# Patient Record
Sex: Male | Born: 1960 | Race: White | Hispanic: No | Marital: Married | Smoking: Never smoker
Health system: Southern US, Community
[De-identification: ages and names within clinical notes are randomized; demographics above are authoritative.]

## PROBLEM LIST (undated history)

## (undated) DIAGNOSIS — S59909A Unspecified injury of unspecified elbow, initial encounter: Secondary | ICD-10-CM

## (undated) HISTORY — PX: APPENDECTOMY: SHX54

## (undated) HISTORY — DX: Unspecified injury of unspecified elbow, initial encounter: S59.909A

---

## 2005-06-05 HISTORY — PX: CERVICAL DISC SURGERY: SHX588

## 2012-05-12 ENCOUNTER — Emergency Department: Payer: Self-pay | Admitting: Emergency Medicine

## 2012-05-12 LAB — CBC
HCT: 48.8 % (ref 40.0–52.0)
MCH: 28 pg (ref 26.0–34.0)
MCHC: 33.8 g/dL (ref 32.0–36.0)
MCV: 83 fL (ref 80–100)
Platelet: 202 10*3/uL (ref 150–440)
RBC: 5.89 10*6/uL (ref 4.40–5.90)
WBC: 8 10*3/uL (ref 3.8–10.6)

## 2012-05-12 LAB — COMPREHENSIVE METABOLIC PANEL
Albumin: 4.2 g/dL (ref 3.4–5.0)
Alkaline Phosphatase: 111 U/L (ref 50–136)
Anion Gap: 6 — ABNORMAL LOW (ref 7–16)
BUN: 16 mg/dL (ref 7–18)
Co2: 28 mmol/L (ref 21–32)
Creatinine: 1 mg/dL (ref 0.60–1.30)
EGFR (African American): >60
EGFR (Non-African Amer.): >60
Glucose: 120 mg/dL — ABNORMAL HIGH (ref 65–99)
Potassium: 3.6 mmol/L (ref 3.5–5.1)
SGOT(AST): 26 U/L (ref 15–37)
SGPT (ALT): 41 U/L (ref 12–78)
Total Protein: 7.8 g/dL (ref 6.4–8.2)

## 2012-05-12 LAB — URINALYSIS, COMPLETE
Bacteria: NONE SEEN
Bilirubin,UR: NEGATIVE
Blood: NEGATIVE
Glucose,UR: NEGATIVE mg/dL
Ketone: NEGATIVE
Leukocyte Esterase: NEGATIVE
Nitrite: NEGATIVE
Ph: 5
Protein: NEGATIVE
RBC,UR: 1 /HPF
Specific Gravity: 1.015
Squamous Epithelial: NONE SEEN
WBC UR: 3 /HPF

## 2012-05-12 LAB — LIPASE, BLOOD: Lipase: 115 U/L

## 2013-02-05 DIAGNOSIS — Z981 Arthrodesis status: Secondary | ICD-10-CM | POA: Insufficient documentation

## 2013-02-05 DIAGNOSIS — B078 Other viral warts: Secondary | ICD-10-CM | POA: Insufficient documentation

## 2013-02-06 DIAGNOSIS — I10 Essential (primary) hypertension: Secondary | ICD-10-CM | POA: Insufficient documentation

## 2013-02-11 LAB — HM HEPATITIS C SCREENING LAB: HM HEPATITIS C SCREENING: NEGATIVE

## 2013-04-05 LAB — HM COLONOSCOPY

## 2013-04-08 HISTORY — PX: COLONOSCOPY: SHX174

## 2014-11-04 ENCOUNTER — Telehealth: Payer: Self-pay

## 2014-11-04 MED ORDER — DOXYCYCLINE HYCLATE 100 MG PO TABS: 100.0000 mg | ORAL_TABLET | Freq: Two times a day (BID) | ORAL | Status: AC

## 2014-11-04 NOTE — Telephone Encounter (Signed)
Patient called said Levaquin made him sick, please send Doxycycline 100mg  to [facility]

## 2014-11-19 DIAGNOSIS — M5033 Other cervical disc degeneration, cervicothoracic region: Secondary | ICD-10-CM | POA: Insufficient documentation

## 2014-11-19 DIAGNOSIS — D1803 Hemangioma of intra-abdominal structures: Secondary | ICD-10-CM | POA: Insufficient documentation

## 2014-11-19 DIAGNOSIS — E785 Hyperlipidemia, unspecified: Secondary | ICD-10-CM | POA: Insufficient documentation

## 2014-11-19 DIAGNOSIS — J309 Allergic rhinitis, unspecified: Secondary | ICD-10-CM | POA: Insufficient documentation

## 2014-11-19 DIAGNOSIS — B009 Herpesviral infection, unspecified: Secondary | ICD-10-CM | POA: Insufficient documentation

## 2014-11-19 DIAGNOSIS — K219 Gastro-esophageal reflux disease without esophagitis: Secondary | ICD-10-CM | POA: Insufficient documentation

## 2014-11-19 DIAGNOSIS — G44209 Tension-type headache, unspecified, not intractable: Secondary | ICD-10-CM | POA: Insufficient documentation

## 2014-11-19 DIAGNOSIS — N281 Cyst of kidney, acquired: Secondary | ICD-10-CM | POA: Insufficient documentation

## 2014-11-19 DIAGNOSIS — K580 Irritable bowel syndrome with diarrhea: Secondary | ICD-10-CM | POA: Insufficient documentation

## 2014-11-19 DIAGNOSIS — Q984 Klinefelter syndrome, unspecified: Secondary | ICD-10-CM | POA: Insufficient documentation

## 2014-11-19 DIAGNOSIS — L209 Atopic dermatitis, unspecified: Secondary | ICD-10-CM | POA: Insufficient documentation

## 2014-11-19 DIAGNOSIS — J40 Bronchitis, not specified as acute or chronic: Secondary | ICD-10-CM | POA: Insufficient documentation

## 2014-11-20 ENCOUNTER — Encounter: Payer: Self-pay | Admitting: Internal Medicine

## 2014-11-20 ENCOUNTER — Ambulatory Visit (INDEPENDENT_AMBULATORY_CARE_PROVIDER_SITE_OTHER): Payer: Managed Care, Other (non HMO) | Admitting: Internal Medicine

## 2014-11-20 VITALS — BP 110/80 | HR 84 | Temp 97.6°F | Ht 69.0 in | Wt 180.6 lb

## 2014-11-20 DIAGNOSIS — J0100 Acute maxillary sinusitis, unspecified: Secondary | ICD-10-CM | POA: Diagnosis not present

## 2014-11-20 DIAGNOSIS — Z86010 Personal history of colonic polyps: Secondary | ICD-10-CM | POA: Diagnosis not present

## 2014-11-20 DIAGNOSIS — J40 Bronchitis, not specified as acute or chronic: Secondary | ICD-10-CM

## 2014-11-20 MED ORDER — HYDROCODONE-HOMATROPINE 5-1.5 MG/5ML PO SYRP: 5.0000 mL | ORAL_SOLUTION | Freq: Four times a day (QID) | ORAL | Status: DC | PRN

## 2014-11-20 MED ORDER — AMOXICILLIN-POT CLAVULANATE 875-125 MG PO TABS: 1.0000 | ORAL_TABLET | Freq: Two times a day (BID) | ORAL | Status: DC

## 2014-11-20 NOTE — Patient Instructions (Addendum)
Take antibiotics with food.   Supplement with a probiotic while taking antibiotics Use cough syrup as needed - mainly at night

## 2014-11-20 NOTE — Progress Notes (Signed)
Date:  11/20/2014   Name:  Joseph Chung   DOB:  [birth year]   MRN:  [id number]   Chief Complaint: Cough Cough This is a chronic problem. The current episode started 1 to 4 weeks ago (seen three weeks ago - treated with levaquin but could not tolerate so medication changed to doxycyline.). The problem has been gradually worsening. The problem occurs every few minutes. The cough is productive of sputum. Associated symptoms include postnasal drip, a sore throat and wheezing. Pertinent negatives include no chills, fever, headaches, hemoptysis, rhinorrhea or shortness of breath. The symptoms are aggravated by lying down and exercise. He has tried prescription cough suppressant (codeine cough syrup made him sick.) for the symptoms.     Review of Systems:  Review of Systems  Constitutional: Negative for fever and chills.  HENT: Positive for postnasal drip and sore throat. Negative for rhinorrhea, sinus pressure and trouble swallowing.   Respiratory: Positive for cough and wheezing. Negative for hemoptysis and shortness of breath.   Gastrointestinal: Negative for diarrhea and blood in stool.  Neurological: Negative for dizziness, light-headedness and headaches.  Hematological: Negative for adenopathy.    Patient Active Problem List   Diagnosis Date Noted  . Allergic rhinitis 11/19/2014  . AD (atopic dermatitis) 11/19/2014  . Bronchitis 11/19/2014  . 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 Dyslipidemia 11/19/2014  . Acid reflux 11/19/2014  . Hemangioma of liver 11/19/2014  . Herpes 11/19/2014  . Irritable bowel syndrome with diarrhea 11/19/2014  . Klinefelter syndrome 11/19/2014  . Acquired cyst of kidney 11/19/2014  . Headache, tension-type 11/19/2014  . BP (high blood pressure) 02/06/2013  . H/O arthrodesis 02/05/2013  . Common wart 02/05/2013    Prior to Admission medications   Medication Sig Start Date End Date Taking? Authorizing Provider  acyclovir cream  (ZOVIRAX) 5 % 1 application 2 (two) times daily. 02/06/13   Historical Provider, MD  aspirin EC 81 MG tablet Take 1 tablet by mouth daily.    Historical Provider, MD  Coconut Oil 1000 MG CAPS Take by mouth.    Historical Provider, MD  niacin (NIASPAN) 1000 MG CR tablet Take 2 tablets by mouth daily. 04/24/14   Historical Provider, MD  Omega-3 Fatty Acids (FISH OIL) 1000 MG CAPS Take 2 capsules by mouth 2 (two) times daily.    Historical Provider, MD  Omeprazole 20 MG TBEC Take 1 tablet by mouth 2 (two) times daily. 03/20/14   Historical Provider, MD  testosterone (ANDROGEL) 50 MG/5GM (1%) GEL Place 2 Units onto the skin daily. 08/14/14   Historical Provider, MD  vitamin E (VITAMIN E) 1000 UNIT capsule Take 2 capsules by mouth daily.    Historical Provider, MD    Allergies  Allergen Reactions  . Codeine Nausea And Vomiting and Nausea Only    Patient states he feels "near death" when taking    Past Surgical History  Procedure Laterality Date  . Cervical disc surgery  2007  . Appendectomy      History  Substance Use Topics  . Smoking status: Never Smoker   . Smokeless tobacco: Not on file  . Alcohol Use: No     Medication list has been reviewed and updated.  Physical Examination:  Physical Exam  Constitutional: He appears well-developed and well-nourished.  HENT:  Mouth/Throat: Oropharyngeal exudate (thick yellow phlegm) present.  Eyes: Conjunctivae are normal. Pupils are equal, round, and reactive to light.  Neck: Neck supple.  Cardiovascular: Normal rate, regular rhythm and normal heart sounds.  Pulmonary/Chest: Effort normal and breath sounds normal. No respiratory distress. He has no wheezes.  Lymphadenopathy:    He has no cervical adenopathy.  Psychiatric: He has a normal mood and affect.    BP 110/80 mmHg  Pulse 84  Temp(Src) 97.6 F (36.4 C)  Ht 5\' 9"  (1.753 m)  Wt 180 lb 9.6 oz (81.92 kg)  BMI 26.66 kg/m2  SpO2 98%  Assessment and Plan: 1.  Bronchitis Incompletely treated by Doxycycline - amoxicillin-clavulanate (AUGMENTIN) 875-125 MG per tablet; Take 1 tablet by mouth 2 (two) times daily.  Dispense: 20 tablet; Refill: 0 - HYDROcodone-homatropine (HYCODAN) 5-1.5 MG/5ML syrup; Take 5 mLs by mouth every 6 (six) hours as needed for cough.  Dispense: 120 mL; Refill: 0  2. Acute maxillary sinusitis, recurrence not specified - amoxicillin-clavulanate (AUGMENTIN) 875-125 MG per tablet; Take 1 tablet by mouth 2 (two) times daily.  Dispense: 20 tablet; Refill: 0   Halina Maidens, MD [facility]  11/20/2014

## 2015-03-24 ENCOUNTER — Other Ambulatory Visit: Payer: Self-pay | Admitting: Internal Medicine

## 2015-04-05 ENCOUNTER — Ambulatory Visit (INDEPENDENT_AMBULATORY_CARE_PROVIDER_SITE_OTHER): Payer: Managed Care, Other (non HMO) | Admitting: Internal Medicine

## 2015-04-05 ENCOUNTER — Encounter: Payer: Self-pay | Admitting: Internal Medicine

## 2015-04-05 VITALS — BP 110/78 | HR 96 | Temp 97.8°F | Ht 70.0 in | Wt 174.0 lb

## 2015-04-05 DIAGNOSIS — J019 Acute sinusitis, unspecified: Secondary | ICD-10-CM

## 2015-04-05 DIAGNOSIS — K649 Unspecified hemorrhoids: Secondary | ICD-10-CM

## 2015-04-05 MED ORDER — FLUTICASONE PROPIONATE 50 MCG/ACT NA SUSP: 2.0000 | Freq: Every day | NASAL | Status: DC

## 2015-04-05 MED ORDER — AMOXICILLIN-POT CLAVULANATE 875-125 MG PO TABS: 1.0000 | ORAL_TABLET | Freq: Two times a day (BID) | ORAL | Status: DC

## 2015-04-05 NOTE — Progress Notes (Signed)
Date:  04/05/2015   Name:  Joseph Chung   DOB:  [birth year]   MRN:  [id number]   Chief Complaint: Sinusitis  Patient complains of several days of sinus congestion along with sneezing, sore throat, cough, and watery eyes. He's been using Mucinex with no benefit. He has also been using Sudafed 30 mg once or twice per day. He is having significant nasal congestion so that is having to sleep sitting up. Been producing some thick yellow mucus. He denies any wheezing or fever. No nausea or vomiting. Rectal bleeding - Patient has a history of colon polyps, last colonoscopy 2014. He now complains of hemorrhoids with blood in his stool. This has been occurring off and on for several months. He's used over-the-counter hemorrhoid cream without benefit. He is requesting a referral for another colonoscopy. I suggest an exam today which he declines or a referral to Gen. Surgery.  Review of Systems  Constitutional: Positive for fatigue. Negative for fever and chills.  HENT: Positive for postnasal drip, rhinorrhea, sinus pressure, sneezing and sore throat. Negative for ear pain, nosebleeds, tinnitus, trouble swallowing and voice change.   Eyes: Negative for visual disturbance.  Respiratory: Positive for cough and shortness of breath. Negative for chest tightness and wheezing.   Cardiovascular: Negative for chest pain, palpitations and leg swelling.  Gastrointestinal: Positive for anal bleeding. Negative for diarrhea and constipation.  Neurological: Negative for dizziness and headaches.  Hematological: Negative for adenopathy.  Psychiatric/Behavioral: Positive for sleep disturbance.    Patient Active Problem List   Diagnosis Date Noted  . Hx of adenomatous colonic polyps 11/20/2014  . Allergic rhinitis 11/19/2014  . AD (atopic dermatitis) 11/19/2014  . Bronchitis 11/19/2014  . 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 Dyslipidemia 11/19/2014  . Acid reflux 11/19/2014  . Hemangioma of  liver 11/19/2014  . Herpes 11/19/2014  . Irritable bowel syndrome with diarrhea 11/19/2014  . Klinefelter syndrome 11/19/2014  . Acquired cyst of kidney 11/19/2014  . Headache, tension-type 11/19/2014  . BP (high blood pressure) 02/06/2013  . H/O arthrodesis 02/05/2013  . Common wart 02/05/2013    Prior to Admission medications   Medication Sig Start Date End Date Taking? Authorizing Provider  acyclovir cream (ZOVIRAX) 5 % 1 application 2 (two) times daily. 02/06/13  Yes Historical Provider, MD  ANDROGEL 50 MG/5GM (1%) GEL APPLY THE CONTENTS OF TWO PACKETS TOPICALLY DAILY 03/24/15  Yes Glean Hess, MD  aspirin EC 81 MG tablet Take 1 tablet by mouth daily.   Yes Historical Provider, MD  Coconut Oil 1000 MG CAPS Take by mouth.   Yes Historical Provider, MD  niacin (NIASPAN) 1000 MG CR tablet Take 2 tablets by mouth daily. 04/24/14  Yes Historical Provider, MD  Omega-3 Fatty Acids (FISH OIL) 1000 MG CAPS Take 2 capsules by mouth 2 (two) times daily.   Yes Historical Provider, MD  omeprazole (PRILOSEC) 20 MG capsule TAKE ONE CAPSULE BY MOUTH TWICE A DAY 03/24/15  Yes Glean Hess, MD  vitamin E (VITAMIN E) 1000 UNIT capsule Take 2 capsules by mouth daily.   Yes Historical Provider, MD    Allergies  Allergen Reactions  . Codeine Nausea And Vomiting and Nausea Only    Patient states he feels "near death" when taking  . Levaquin [Levofloxacin In D5w] Nausea And Vomiting    Past Surgical History  Procedure Laterality Date  . Cervical disc surgery  2007  . Appendectomy      Social History  Substance Use Topics  .  Smoking status: Never Smoker   . Smokeless tobacco: None  . Alcohol Use: No    Medication list has been reviewed and updated.   Physical Exam  Constitutional: He is oriented to person, place, and time. He appears well-developed. No distress.  HENT:  Head: Normocephalic and atraumatic.  Right Ear: Tympanic membrane and ear canal normal.  Left Ear: Tympanic  membrane and ear canal normal.  Nose: Right sinus exhibits maxillary sinus tenderness and frontal sinus tenderness. Left sinus exhibits maxillary sinus tenderness and frontal sinus tenderness.  Mouth/Throat: Uvula is midline. Posterior oropharyngeal erythema present.  Eyes: Conjunctivae are normal. Right eye exhibits no discharge. Left eye exhibits no discharge. No scleral icterus.  Neck: No thyromegaly present.  Cardiovascular: Normal rate, regular rhythm and normal heart sounds.   Pulmonary/Chest: Effort normal and breath sounds normal. No respiratory distress.  Abdominal: There is no tenderness.  Musculoskeletal: Normal range of motion.  Neurological: He is alert and oriented to person, place, and time.  Skin: Skin is warm and dry. No rash noted.  Psychiatric: He has a normal mood and affect. His speech is normal and behavior is normal. Thought content normal.  Nursing note and vitals reviewed.   BP 110/78 mmHg  Pulse 96  Temp(Src) 97.8 F (36.6 C)  Ht 5\' 10"  (1.778 m)  Wt 174 lb (78.926 kg)  BMI 24.97 kg/m2  SpO2 99%  Assessment and Plan: 1. Acute sinusitis, recurrence not specified, unspecified location Begin Allegra or Claritin daily Sudafed 30 mg every 4 hours - amoxicillin-clavulanate (AUGMENTIN) 875-125 MG tablet; Take 1 tablet by mouth 2 (two) times daily.  Dispense: 20 tablet; Refill: 0 - fluticasone (FLONASE) 50 MCG/ACT nasal spray; Place 2 sprays into both nostrils daily.  Dispense: 16 g; Refill: 6  2. Bleeding hemorrhoid Patient declines rectal exam today Continue over-the-counter hemorrhoid cream May need General Surgery referral if bleeding continues   Halina Maidens, MD [facility]  04/05/2015

## 2015-04-05 NOTE — Patient Instructions (Signed)
Get Allegra 180 mg or Claritin 10 mg - take once a day  Can take sudafed (or generic) every 4 hours for congestion  Use Flonase and Amox-clavulanate as instructed  If bleeding continues - call for a referral to General Surgery

## 2015-05-13 ENCOUNTER — Other Ambulatory Visit: Payer: Self-pay

## 2015-05-13 DIAGNOSIS — J019 Acute sinusitis, unspecified: Secondary | ICD-10-CM

## 2015-06-04 ENCOUNTER — Other Ambulatory Visit: Payer: Self-pay

## 2015-06-04 DIAGNOSIS — J019 Acute sinusitis, unspecified: Secondary | ICD-10-CM

## 2015-06-04 MED ORDER — ACYCLOVIR 5 % EX CREA: 1.0000 "application " | TOPICAL_CREAM | Freq: Two times a day (BID) | CUTANEOUS | Status: AC

## 2015-06-04 MED ORDER — FLUTICASONE PROPIONATE 50 MCG/ACT NA SUSP: 2.0000 | Freq: Every day | NASAL | Status: DC

## 2015-06-04 MED ORDER — OMEPRAZOLE 20 MG PO CPDR: 20.0000 mg | DELAYED_RELEASE_CAPSULE | Freq: Two times a day (BID) | ORAL | Status: DC

## 2015-06-04 MED ORDER — NIACIN ER (ANTIHYPERLIPIDEMIC) 1000 MG PO TBCR: 2000.0000 mg | EXTENDED_RELEASE_TABLET | Freq: Every day | ORAL | Status: AC

## 2015-06-04 MED ORDER — TESTOSTERONE 50 MG/5GM (1%) TD GEL: 10.0000 g | Freq: Every day | TRANSDERMAL | Status: DC

## 2015-06-04 MED ORDER — FISH OIL 1000 MG PO CAPS: 2.0000 | ORAL_CAPSULE | Freq: Two times a day (BID) | ORAL | Status: DC

## 2015-07-26 ENCOUNTER — Ambulatory Visit: Payer: Self-pay | Admitting: Internal Medicine

## 2015-07-26 ENCOUNTER — Other Ambulatory Visit: Payer: Self-pay

## 2015-07-26 MED ORDER — OSELTAMIVIR PHOSPHATE 75 MG PO CAPS: 75.0000 mg | ORAL_CAPSULE | Freq: Two times a day (BID) | ORAL | Status: DC

## 2015-07-26 NOTE — Telephone Encounter (Signed)
Patient called in this morning stating that his wife tested positive for Influenza A yesterday and states that his symptoms started suddently last night and would like Tamiflu sent in to pharmacy. Please Advise.

## 2015-09-03 ENCOUNTER — Other Ambulatory Visit: Payer: Self-pay | Admitting: Internal Medicine

## 2015-09-03 MED ORDER — OMEPRAZOLE 20 MG PO CPDR: 20.0000 mg | DELAYED_RELEASE_CAPSULE | Freq: Every day | ORAL | Status: DC

## 2015-09-10 ENCOUNTER — Other Ambulatory Visit: Payer: Self-pay | Admitting: Internal Medicine

## 2015-09-10 ENCOUNTER — Telehealth: Payer: Self-pay

## 2015-09-10 MED ORDER — TESTOSTERONE 50 MG/5GM (1%) TD GEL: 10.0000 g | Freq: Every day | TRANSDERMAL | Status: DC

## 2015-09-10 NOTE — Telephone Encounter (Signed)
Patient needs refill sent to pharmacy

## 2015-09-29 ENCOUNTER — Ambulatory Visit
Admission: RE | Admit: 2015-09-29 | Discharge: 2015-09-29 | Disposition: A | Discharge status: Home / Self Care | Payer: Managed Care, Other (non HMO) | Source: Ambulatory Visit | Attending: Internal Medicine | Admitting: Internal Medicine

## 2015-09-29 ENCOUNTER — Ambulatory Visit (INDEPENDENT_AMBULATORY_CARE_PROVIDER_SITE_OTHER): Payer: Managed Care, Other (non HMO) | Admitting: Internal Medicine

## 2015-09-29 ENCOUNTER — Encounter: Payer: Self-pay | Admitting: Internal Medicine

## 2015-09-29 VITALS — BP 114/78 | HR 84 | Ht 70.0 in | Wt 176.0 lb

## 2015-09-29 DIAGNOSIS — J0101 Acute recurrent maxillary sinusitis: Secondary | ICD-10-CM

## 2015-09-29 DIAGNOSIS — K219 Gastro-esophageal reflux disease without esophagitis: Secondary | ICD-10-CM

## 2015-09-29 DIAGNOSIS — S59901A Unspecified injury of right elbow, initial encounter: Secondary | ICD-10-CM

## 2015-09-29 DIAGNOSIS — S59909A Unspecified injury of unspecified elbow, initial encounter: Secondary | ICD-10-CM

## 2015-09-29 DIAGNOSIS — X58XXXA Exposure to other specified factors, initial encounter: Secondary | ICD-10-CM | POA: Diagnosis not present

## 2015-09-29 DIAGNOSIS — M7989 Other specified soft tissue disorders: Secondary | ICD-10-CM | POA: Insufficient documentation

## 2015-09-29 HISTORY — DX: Unspecified injury of unspecified elbow, initial encounter: S59.909A

## 2015-09-29 MED ORDER — FLUTICASONE PROPIONATE 50 MCG/ACT NA SUSP: 2.0000 | Freq: Every day | NASAL | Status: DC

## 2015-09-29 MED ORDER — CEFDINIR 300 MG PO CAPS: 300.0000 mg | ORAL_CAPSULE | Freq: Two times a day (BID) | ORAL | Status: DC

## 2015-09-29 NOTE — Progress Notes (Signed)
Date:  09/29/2015   Name:  Joseph Chung   DOB:  08-09-1960   MRN:  [id number]   Chief Complaint: Sore Throat and Elbow Pain Sore Throat  This is a new problem. The problem has been unchanged. Neither side of throat is experiencing more pain than the other. There has been no fever. The pain is mild. Associated symptoms include congestion, a plugged ear sensation, shortness of breath and swollen glands.  Arm Pain  The incident occurred 2 days ago. The incident occurred at home. The injury mechanism was a direct blow. The pain is present in the right elbow. The quality of the pain is described as aching. The pain does not radiate. The pain is moderate. Associated symptoms include muscle weakness. Pertinent negatives include no chest pain or numbness. The symptoms are aggravated by movement. He has tried rest for the symptoms.  Gastroesophageal Reflux He complains of belching, heartburn and a sore throat. He reports no chest pain or no wheezing. if he uses the over the counter omeprazole. This is a recurrent problem. The problem occurs frequently. Pertinent negatives include no fatigue. There are no known risk factors. He has tried a PPI for the symptoms.  His insurance is denying bid omeprazole.  I think they cover once daily but he is not sure.  I suggest he try otc Prevacid if omeprazole not covered.    Review of Systems  Constitutional: Negative for fever, chills and fatigue.  HENT: Positive for congestion, sinus pressure and sore throat.   Respiratory: Positive for shortness of breath. Negative for chest tightness and wheezing.   Cardiovascular: Negative for chest pain, palpitations and leg swelling.  Gastrointestinal: Positive for heartburn.  Musculoskeletal: Positive for joint swelling and arthralgias.  Neurological: Negative for numbness.    Patient Active Problem List   Diagnosis Date Noted  . Hx of adenomatous colonic polyps 11/20/2014  . Allergic rhinitis 11/19/2014  . AD  (atopic dermatitis) 11/19/2014  . 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 Dyslipidemia 11/19/2014  . Acid reflux 11/19/2014  . Hemangioma of liver 11/19/2014  . Herpes 11/19/2014  . Irritable bowel syndrome with diarrhea 11/19/2014  . Klinefelter syndrome 11/19/2014  . Acquired cyst of kidney 11/19/2014  . Headache, tension-type 11/19/2014  . BP (high blood pressure) 02/06/2013  . H/O arthrodesis 02/05/2013  . Common wart 02/05/2013    Prior to Admission medications   Medication Sig Start Date End Date Taking? Authorizing Provider  acyclovir cream (ZOVIRAX) 5 % Apply 1 application topically 2 (two) times daily. 06/04/15  Yes Glean Hess, MD  aspirin EC 81 MG tablet Take 1 tablet by mouth daily.   Yes Historical Provider, MD  Biotin 10 MG TABS Take by mouth.   Yes Historical Provider, MD  Coconut Oil 1000 MG CAPS Take by mouth.   Yes Historical Provider, MD  milk thistle 175 MG tablet Take 175 mg by mouth daily.   Yes Historical Provider, MD  niacin (NIASPAN) 1000 MG CR tablet Take 2 tablets (2,000 mg total) by mouth daily. 06/04/15  Yes Glean Hess, MD  Omega-3 Fatty Acids (FISH OIL) 1000 MG CAPS Take 2 capsules (2,000 mg total) by mouth 2 (two) times daily. 06/04/15  Yes Glean Hess, MD  omeprazole (PRILOSEC) 20 MG capsule Take 1 capsule (20 mg total) by mouth daily. 09/03/15  Yes Glean Hess, MD  testosterone (ANDROGEL) 50 MG/5GM (1%) GEL Place 10 g onto the skin daily. 09/10/15  Yes Mickel Baas  Ines Bloomer, MD  vitamin A 10000 UNIT capsule Take 2,000 Units by mouth daily.   Yes Historical Provider, MD  vitamin C (ASCORBIC ACID) 500 MG tablet Take 500 mg by mouth daily.   Yes Historical Provider, MD  vitamin E (VITAMIN E) 1000 UNIT capsule Take 2 capsules by mouth daily.   Yes Historical Provider, MD    Allergies  Allergen Reactions  . Codeine Nausea And Vomiting and Nausea Only    Patient states he feels "near death" when taking  . Levaquin  [Levofloxacin In D5w] Nausea And Vomiting    Past Surgical History  Procedure Laterality Date  . Cervical disc surgery  2007  . Appendectomy      Social History  Substance Use Topics  . Smoking status: Never Smoker   . Smokeless tobacco: None  . Alcohol Use: No     Medication list has been reviewed and updated.   Physical Exam  Constitutional: He is oriented to person, place, and time. He appears well-developed. No distress.  HENT:  Head: Normocephalic and atraumatic.  Neck: Normal range of motion. Neck supple.  Cardiovascular: Normal rate, regular rhythm and normal heart sounds.   Pulmonary/Chest: Effort normal and breath sounds normal. No respiratory distress. He has no wheezes.  Abdominal: Soft. There is no tenderness. There is no rebound.  Musculoskeletal: Normal range of motion.       Right elbow: He exhibits swelling and effusion. Tenderness found. Lateral epicondyle tenderness noted.  Decreased grip strength on right  Lymphadenopathy:    He has no cervical adenopathy.  Neurological: He is alert and oriented to person, place, and time.  Skin: Skin is warm and dry. No rash noted.  Psychiatric: He has a normal mood and affect. His behavior is normal. Thought content normal.  Nursing note and vitals reviewed.   BP 114/78 mmHg  Pulse 84  Ht 5\' 10"  (1.778 m)  Wt 176 lb (79.833 kg)  BMI 25.25 kg/m2  Assessment and Plan: 1. Acute recurrent maxillary sinusitis - cefdinir (OMNICEF) 300 MG capsule; Take 1 capsule (300 mg total) by mouth 2 (two) times daily.  Dispense: 20 capsule; Refill: 0 - fluticasone (FLONASE) 50 MCG/ACT nasal spray; Place 2 sprays into both nostrils daily.  Dispense: 16 g; Refill: 3  2. Elbow injury, right, initial encounter Concern for fracture Limit movement - take advil if needed If fracture - will refer to Orthopedics - DG Elbow Complete Right; Future  3. Gastroesophageal reflux disease without esophagitis Has not been controlled on OTC  omeprazole If he can not get the omeprazole RX once daily, then try OTC Prevacid 15 mg   Halina Maidens, MD [facility]  09/29/2015

## 2015-09-30 ENCOUNTER — Telehealth: Payer: Self-pay

## 2015-09-30 NOTE — Telephone Encounter (Signed)
-----   Message from Glean Hess, MD sent at 09/30/2015  1:51 PM EDT ----- No fracture.  Continue advil or aleve as needed; use arm cautiously until healed.

## 2015-09-30 NOTE — Telephone Encounter (Signed)
Spoke with patient. Patient advised of all results and verbalized understanding. Will call back with any future questions or concerns. MAH  

## 2015-10-05 ENCOUNTER — Other Ambulatory Visit: Payer: Self-pay | Admitting: Internal Medicine

## 2015-10-08 ENCOUNTER — Telehealth: Payer: Self-pay

## 2015-10-08 NOTE — Telephone Encounter (Signed)
° °

## 2015-10-08 NOTE — Telephone Encounter (Signed)
Right elbow has gotten worse. Waking up in pain all night. Rotating IBP and Tyl but not helping. He usually hates narcotics. He had some old ones but threw away.

## 2015-10-18 ENCOUNTER — Other Ambulatory Visit: Payer: Self-pay | Admitting: Internal Medicine

## 2015-10-18 MED ORDER — OMEPRAZOLE 20 MG PO CPDR: 20.0000 mg | DELAYED_RELEASE_CAPSULE | Freq: Every day | ORAL | Status: DC

## 2015-10-19 ENCOUNTER — Telehealth: Payer: Self-pay

## 2015-10-19 NOTE — Telephone Encounter (Signed)
Left VM that he went to [facility] ER for severe abdominal pains and NVD. He was given Zofran and waited for 3 hours then left. After fluids and rest he feels much better today just FYI.

## 2016-01-26 ENCOUNTER — Telehealth: Payer: Self-pay

## 2016-01-31 ENCOUNTER — Ambulatory Visit: Payer: Self-pay | Admitting: Internal Medicine

## 2016-02-16 NOTE — Telephone Encounter (Signed)
Completed.

## 2016-04-05 ENCOUNTER — Ambulatory Visit (INDEPENDENT_AMBULATORY_CARE_PROVIDER_SITE_OTHER): Payer: Managed Care, Other (non HMO) | Admitting: Internal Medicine

## 2016-04-05 ENCOUNTER — Encounter: Payer: Self-pay | Admitting: Internal Medicine

## 2016-04-05 VITALS — BP 112/78 | HR 77 | Temp 97.7°F | Resp 16 | Ht 70.0 in | Wt 165.0 lb

## 2016-04-05 DIAGNOSIS — Z23 Encounter for immunization: Secondary | ICD-10-CM | POA: Diagnosis not present

## 2016-04-05 DIAGNOSIS — D126 Benign neoplasm of colon, unspecified: Secondary | ICD-10-CM

## 2016-04-05 DIAGNOSIS — J0100 Acute maxillary sinusitis, unspecified: Secondary | ICD-10-CM | POA: Diagnosis not present

## 2016-04-05 MED ORDER — BENZONATATE 200 MG PO CAPS: 200.0000 mg | ORAL_CAPSULE | Freq: Three times a day (TID) | ORAL | 0 refills | Status: DC | PRN

## 2016-04-05 MED ORDER — ONDANSETRON HCL 8 MG PO TABS: 8.0000 mg | ORAL_TABLET | Freq: Three times a day (TID) | ORAL | 0 refills | Status: DC | PRN

## 2016-04-05 MED ORDER — AMOXICILLIN-POT CLAVULANATE 875-125 MG PO TABS: 1.0000 | ORAL_TABLET | Freq: Two times a day (BID) | ORAL | 0 refills | Status: DC

## 2016-04-05 NOTE — Progress Notes (Signed)
Date:  04/05/2016   Name:  Joseph Chung   DOB:  [birth year]   MRN:  [id number]   Chief Complaint: Sinus Problem (Found 4 pills from Abx 2 years ago and took it. Has had sinus pain, headache, bodyache and coughing and SOB for 4 days. ); GI Problem (wants colonoscopy ordered and zofran to have around. Changed diet now vegitarian and drinking healthy smoothy in m orning now and using many oils and other vitamins for major health changes. ); and Immunizations (Tdap today ) Sinus Problem  This is a new problem. The current episode started in the past 7 days. The problem has been gradually worsening since onset. There has been no fever. Associated symptoms include coughing, headaches, sinus pressure and a sore throat. Pertinent negatives include no chills, ear pain or shortness of breath.   Colonoscopy - is due for his three year repeat.  He has not yet gotten a letter from GI and his not sure who it was.  It was done at the endoscopy center in [place].  Review of records shows it was Dr. Comer Locket.   Review of Systems  Constitutional: Negative for chills, fatigue and fever.  HENT: Positive for sinus pressure and sore throat. Negative for ear pain.   Eyes: Negative for visual disturbance.  Respiratory: Positive for cough. Negative for shortness of breath.   Gastrointestinal: Negative for abdominal distention and blood in stool.  Endocrine: Negative for polydipsia and polyuria.  Neurological: Positive for headaches. Negative for dizziness and syncope.    Patient Active Problem List   Diagnosis Date Noted  . Elbow injury 09/29/2015  . Hx of adenomatous colonic polyps 11/20/2014  . Allergic rhinitis 11/19/2014  . AD (atopic dermatitis) 11/19/2014  . 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 Dyslipidemia 11/19/2014  . Acid reflux 11/19/2014  . Hemangioma of liver 11/19/2014  . Herpes 11/19/2014  . Irritable bowel syndrome with diarrhea 11/19/2014  . Klinefelter syndrome  11/19/2014  . Acquired cyst of kidney 11/19/2014  . Headache, tension-type 11/19/2014  . BP (high blood pressure) 02/06/2013  . H/O arthrodesis 02/05/2013  . Common wart 02/05/2013    Prior to Admission medications   Medication Sig Start Date End Date Taking? Authorizing Provider  acyclovir cream (ZOVIRAX) 5 % Apply 1 application topically 2 (two) times daily. 06/04/15  Yes Glean Hess, MD  aspirin EC 81 MG tablet Take 1 tablet by mouth every other day.    Yes Historical Provider, MD  Biotin 10 MG TABS Take by mouth.   Yes Historical Provider, MD  CHARCOAL ACTIVATED PO Take by mouth.   Yes Historical Provider, MD  Coconut Oil 1000 MG CAPS Take by mouth.   Yes Historical Provider, MD  milk thistle 175 MG tablet Take 175 mg by mouth daily.   Yes Historical Provider, MD  niacin (NIASPAN) 1000 MG CR tablet Take 2 tablets (2,000 mg total) by mouth daily. 06/04/15  Yes Glean Hess, MD  olive oil external oil Apply topically as needed.   Yes Historical Provider, MD  Omega-3 Fatty Acids (FISH OIL) 1000 MG CAPS Take 2 capsules (2,000 mg total) by mouth 2 (two) times daily. 06/04/15  Yes Glean Hess, MD  testosterone (ANDROGEL) 50 MG/5GM (1%) GEL Place 10 g onto the skin daily. 09/10/15  Yes Glean Hess, MD    Allergies  Allergen Reactions  . Codeine Nausea And Vomiting and Nausea Only    Patient states he feels "near death" when  taking  . Levaquin [Levofloxacin In D5w] Nausea And Vomiting    Past Surgical History:  Procedure Laterality Date  . APPENDECTOMY    . [place] SURGERY  2007    Social History  Substance Use Topics  . Smoking status: Never Smoker  . Smokeless tobacco: Never Used  . Alcohol use No     Medication list has been reviewed and updated.   Physical Exam  Constitutional: He is oriented to person, place, and time. He appears well-developed and well-nourished.  HENT:  Right Ear: External ear and ear canal normal. Tympanic membrane is not  erythematous and not retracted.  Left Ear: External ear and ear canal normal. Tympanic membrane is not erythematous and not retracted.  Nose: Right sinus exhibits maxillary sinus tenderness and frontal sinus tenderness. Left sinus exhibits maxillary sinus tenderness and frontal sinus tenderness.  Mouth/Throat: Uvula is midline and mucous membranes are normal. No oral lesions. No oropharyngeal exudate or posterior oropharyngeal erythema.  Cardiovascular: Normal rate, regular rhythm and normal heart sounds.   Pulmonary/Chest: Breath sounds normal. He has no wheezes. He has no rales.  Lymphadenopathy:    He has no cervical adenopathy.  Neurological: He is alert and oriented to person, place, and time.    BP 112/78   Pulse 77   Temp 97.7 F (36.5 C) (Oral)   Resp 16   Ht 5\' 10"  (1.778 m)   Wt 165 lb (74.8 kg)   SpO2 97%   BMI 23.68 kg/m   Assessment and Plan 1. Acute maxillary sinusitis, recurrence not specified - amoxicillin-clavulanate (AUGMENTIN) 875-125 MG tablet; Take 1 tablet by mouth 2 (two) times daily.  Dispense: 20 tablet; Refill: 0 - benzonatate (TESSALON) 200 MG capsule; Take 1 capsule (200 mg total) by mouth 3 (three) times daily as needed for cough.  Dispense: 30 capsule; Refill: 0  2. Need for diphtheria-tetanus-pertussis (Tdap) vaccine - Tdap vaccine greater than or equal to 7yo IM  3. Serrated adenoma of colon Due for follow up with Dr. Comer Locket   Halina Maidens, MD [facility]  04/05/2016

## 2016-04-05 NOTE — Patient Instructions (Signed)
Tdap Vaccine (Tetanus, Diphtheria and Pertussis): What You Need to Know 1. Why get vaccinated? Tetanus, diphtheria and pertussis are very serious diseases. Tdap vaccine can protect us from these diseases. And, Tdap vaccine given to pregnant women can protect newborn babies against pertussis. TETANUS (Lockjaw) is rare in the [place] today. It causes painful muscle tightening and stiffness, usually all over the body.  It can lead to tightening of muscles in the head and neck so you can't open your mouth, swallow, or sometimes even breathe. Tetanus kills about 1 out of 10 people who are infected even after receiving the best medical care. DIPHTHERIA is also rare in the [place] today. It can cause a thick coating to form in the back of the throat.  It can lead to breathing problems, heart failure, paralysis, and death. PERTUSSIS (Whooping Cough) causes severe coughing spells, which can cause difficulty breathing, vomiting and disturbed sleep.  It can also lead to weight loss, incontinence, and rib fractures. Up to 2 in 100 adolescents and 5 in 100 adults with pertussis are hospitalized or have complications, which could include pneumonia or death. These diseases are caused by bacteria. Diphtheria and pertussis are spread from person to person through secretions from coughing or sneezing. Tetanus enters the body through cuts, scratches, or wounds. Before vaccines, as many as 200,000 cases of diphtheria, 200,000 cases of pertussis, and hundreds of cases of tetanus, were reported in the [place] each year. Since vaccination began, reports of cases for tetanus and diphtheria have dropped by about 99% and for pertussis by about 80%. 2. Tdap vaccine Tdap vaccine can protect adolescents and adults from tetanus, diphtheria, and pertussis. One dose of Tdap is routinely given at age 11 or 12. People who did not get Tdap at that age should get it as soon as possible. Tdap is especially important  for healthcare professionals and anyone having close contact with a baby younger than 12 months. Pregnant women should get a dose of Tdap during every pregnancy, to protect the newborn from pertussis. Infants are most at risk for severe, life-threatening complications from pertussis. Another vaccine, called Td, protects against tetanus and diphtheria, but not pertussis. A Td booster should be given every 10 years. Tdap may be given as one of these boosters if you have never gotten Tdap before. Tdap may also be given after a severe cut or burn to prevent tetanus infection. Your doctor or the person giving you the vaccine can give you more information. Tdap may safely be given at the same time as other vaccines. 3. Some people should not get this vaccine  A person who has ever had a life-threatening allergic reaction after a previous dose of any diphtheria, tetanus or pertussis containing vaccine, OR has a severe allergy to any part of this vaccine, should not get Tdap vaccine. Tell the person giving the vaccine about any severe allergies.  Anyone who had coma or long repeated seizures within 7 days after a childhood dose of DTP or DTaP, or a previous dose of Tdap, should not get Tdap, unless a cause other than the vaccine was found. They can still get Td.  Talk to your doctor if you:  have seizures or another nervous system problem,  had severe pain or swelling after any vaccine containing diphtheria, tetanus or pertussis,  ever had a condition called Guillain-Barr Syndrome (GBS),  aren't feeling well on the day the shot is scheduled. 4. Risks With any medicine, including vaccines, there is   a chance of side effects. These are usually mild and go away on their own. Serious reactions are also possible but are rare. Most people who get Tdap vaccine do not have any problems with it. Mild problems following Tdap (Did not interfere with activities)  Pain where the shot was given (about 3 in 4  adolescents or 2 in 3 adults)  Redness or swelling where the shot was given (about 1 person in 5)  Mild fever of at least 100.4F (up to about 1 in 25 adolescents or 1 in 100 adults)  Headache (about 3 or 4 people in 10)  Tiredness (about 1 person in 3 or 4)  Nausea, vomiting, diarrhea, stomach ache (up to 1 in 4 adolescents or 1 in 10 adults)  Chills, sore joints (about 1 person in 10)  Body aches (about 1 person in 3 or 4)  Rash, swollen glands (uncommon) Moderate problems following Tdap (Interfered with activities, but did not require medical attention)  Pain where the shot was given (up to 1 in 5 or 6)  Redness or swelling where the shot was given (up to about 1 in 16 adolescents or 1 in 12 adults)  Fever over 102F (about 1 in 100 adolescents or 1 in 250 adults)  Headache (about 1 in 7 adolescents or 1 in 10 adults)  Nausea, vomiting, diarrhea, stomach ache (up to 1 or 3 people in 100)  Swelling of the entire arm where the shot was given (up to about 1 in 500). Severe problems following Tdap (Unable to perform usual activities; required medical attention)  Swelling, severe pain, bleeding and redness in the arm where the shot was given (rare). Problems that could happen after any vaccine:  People sometimes faint after a medical procedure, including vaccination. Sitting or lying down for about 15 minutes can help prevent fainting, and injuries caused by a fall. Tell your doctor if you feel dizzy, or have vision changes or ringing in the ears.  Some people get severe pain in the shoulder and have difficulty moving the arm where a shot was given. This happens very rarely.  Any medication can cause a severe allergic reaction. Such reactions from a vaccine are very rare, estimated at fewer than 1 in a million doses, and would happen within a few minutes to a few hours after the vaccination. As with any medicine, there is a very remote chance of a vaccine causing a serious  injury or death. The safety of vaccines is always being monitored. For more information, visit: www.cdc.gov/vaccinesafety/ 5. What if there is a serious problem? What should I look for?  Look for anything that concerns you, such as signs of a severe allergic reaction, very high fever, or unusual behavior.  Signs of a severe allergic reaction can include hives, swelling of the face and throat, difficulty breathing, a fast heartbeat, dizziness, and weakness. These would usually start a few minutes to a few hours after the vaccination. What should I do?  If you think it is a severe allergic reaction or other emergency that can't wait, call 9-1-1 or get the person to the nearest hospital. Otherwise, call your doctor.  Afterward, the reaction should be reported to the Vaccine Adverse Event Reporting System (VAERS). Your doctor might file this report, or you can do it yourself through the VAERS web site at www.vaers.hhs.gov, or by calling 1-800-822-7967. VAERS does not give medical advice.  6. The National Vaccine Injury Compensation Program The National Vaccine Injury Compensation Program (  VICP) is a federal program that was created to compensate people who may have been injured by certain vaccines. Persons who believe they may have been injured by a vaccine can learn about the program and about filing a claim by calling 1-800-338-2382 or visiting the VICP website at www.hrsa.gov/vaccinecompensation. There is a time limit to file a claim for compensation. 7. How can I learn more?  Ask your doctor. He or she can give you the vaccine package insert or suggest other sources of information.  Call your local or state health department.  Contact the Centers for Disease Control and Prevention (CDC):  Call 1-800-232-4636 (1-800-CDC-INFO) or  Visit CDC's website at www.cdc.gov/vaccines CDC Tdap Vaccine VIS (07/29/13)   This information is not intended to replace advice given to you by your health care  provider. Make sure you discuss any questions you have with your health care provider.   Document Released: 11/21/2011 Document Revised: 06/12/2014 Document Reviewed: 09/03/2013 Elsevier Interactive Patient Education 2016 Elsevier Inc.  

## 2016-04-06 ENCOUNTER — Other Ambulatory Visit: Payer: Self-pay

## 2016-04-21 ENCOUNTER — Telehealth: Payer: Self-pay

## 2016-04-21 ENCOUNTER — Other Ambulatory Visit: Payer: Self-pay | Admitting: Internal Medicine

## 2016-04-21 MED ORDER — CEFUROXIME AXETIL 250 MG/5ML PO SUSR: 500.0000 mg | Freq: Two times a day (BID) | ORAL | 0 refills | Status: DC

## 2016-04-21 NOTE — Telephone Encounter (Signed)
Ceftin suspension sent to pharmacy.

## 2016-04-21 NOTE — Telephone Encounter (Signed)
Does not feel Abx did not work. Will call back to see why not.

## 2016-04-21 NOTE — Telephone Encounter (Signed)
Cough is horrible at night and never has let up at all. The lidocaine made him feel like lungs were not working well. No fever. Some Sore throat and Chest congestion is worst. Patient feels worse. Please send new Rx.  Codeine Allergy. Would like to do dissolvable or liquid Abx that he can mix with yogurt.

## 2016-05-08 ENCOUNTER — Telehealth: Payer: Self-pay

## 2016-05-08 NOTE — Telephone Encounter (Signed)
Liquid Abx causing vomiting. Can not get any additional out of stock so he will no longer take any Abx at this time per VM.

## 2016-06-06 ENCOUNTER — Encounter: Payer: Self-pay | Admitting: Internal Medicine

## 2016-06-06 ENCOUNTER — Ambulatory Visit (INDEPENDENT_AMBULATORY_CARE_PROVIDER_SITE_OTHER): Payer: Managed Care, Other (non HMO) | Admitting: Internal Medicine

## 2016-06-06 VITALS — BP 148/96 | HR 88 | Temp 97.6°F | Ht 71.0 in | Wt 172.0 lb

## 2016-06-06 DIAGNOSIS — J40 Bronchitis, not specified as acute or chronic: Secondary | ICD-10-CM | POA: Diagnosis not present

## 2016-06-06 MED ORDER — CEFDINIR 300 MG PO CAPS: 300.0000 mg | ORAL_CAPSULE | Freq: Two times a day (BID) | ORAL | 0 refills | Status: DC

## 2016-06-06 MED ORDER — HYDROCODONE-HOMATROPINE 5-1.5 MG/5ML PO SYRP: 5.0000 mL | ORAL_SOLUTION | Freq: Four times a day (QID) | ORAL | 0 refills | Status: DC | PRN

## 2016-06-06 NOTE — Progress Notes (Signed)
Date:  06/06/2016   Name:  Caspar Tokarczyk   DOB:  [birth year]   MRN:  [id number]   Chief Complaint: Sinus Problem Sinus Problem  This is a new problem. The current episode started in the past 7 days. The problem has been gradually worsening since onset. There has been no fever. Associated symptoms include congestion, coughing and a sore throat. Pertinent negatives include no chills, diaphoresis, headaches, shortness of breath or sinus pressure. Past treatments include nothing.  His sx are also in his chest with cough and thick sputum, some sinus sx - mainly nasal congestion and difficulty breathing at night.  He has never had sinus surgery or ENT evaluation.  Review of Systems  Constitutional: Negative for chills, diaphoresis and fever.  HENT: Positive for congestion, rhinorrhea and sore throat. Negative for sinus pain and sinus pressure.   Respiratory: Positive for cough and chest tightness. Negative for shortness of breath and wheezing.   Cardiovascular: Negative for chest pain and palpitations.  Gastrointestinal: Negative for abdominal pain, nausea and vomiting.  Musculoskeletal: Negative for arthralgias.  Allergic/Immunologic: Negative for environmental allergies.  Neurological: Negative for dizziness and headaches.    Patient Active Problem List   Diagnosis Date Noted  . Serrated adenoma of colon 04/05/2016  . Elbow injury 09/29/2015  . Hx of adenomatous colonic polyps 11/20/2014  . Allergic rhinitis 11/19/2014  . AD (atopic dermatitis) 11/19/2014  . 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 Dyslipidemia 11/19/2014  . Acid reflux 11/19/2014  . Hemangioma of liver 11/19/2014  . Herpes 11/19/2014  . Irritable bowel syndrome with diarrhea 11/19/2014  . Klinefelter syndrome 11/19/2014  . Acquired cyst of kidney 11/19/2014  . Headache, tension-type 11/19/2014  . H/O arthrodesis 02/05/2013  . Common wart 02/05/2013    Prior to Admission medications     Medication Sig Start Date End Date Taking? Authorizing Provider  aspirin EC 81 MG tablet Take 1 tablet by mouth every other day.    Yes Historical Provider, MD  Biotin 10 MG TABS Take by mouth.   Yes Historical Provider, MD  CHARCOAL ACTIVATED PO Take by mouth.   Yes Historical Provider, MD  Coconut Oil 1000 MG CAPS Take by mouth.   Yes Historical Provider, MD  milk thistle 175 MG tablet Take 175 mg by mouth daily.   Yes Historical Provider, MD  niacin (NIASPAN) 1000 MG CR tablet Take 2 tablets (2,000 mg total) by mouth daily. 06/04/15  Yes Glean Hess, MD  olive oil external oil Apply topically as needed.   Yes Historical Provider, MD  Omega-3 Fatty Acids (FISH OIL) 1000 MG CAPS Take 2 capsules (2,000 mg total) by mouth 2 (two) times daily. 06/04/15  Yes Glean Hess, MD  ondansetron (ZOFRAN) 8 MG tablet Take 1 tablet (8 mg total) by mouth every 8 (eight) hours as needed for nausea or vomiting. 04/05/16  Yes Glean Hess, MD  testosterone (ANDROGEL) 50 MG/5GM (1%) GEL Place 10 g onto the skin daily. 09/10/15  Yes Glean Hess, MD  acyclovir cream (ZOVIRAX) 5 % Apply 1 application topically 2 (two) times daily. Patient not taking: Reported on 06/06/2016 06/04/15   Glean Hess, MD    Allergies  Allergen Reactions  . Codeine Nausea And Vomiting and Nausea Only    Patient states he feels "near death" when taking  . Levofloxacin Nausea And Vomiting    Past Surgical History:  Procedure Laterality Date  . APPENDECTOMY    . CERVICAL  [place] SURGERY  2007    Social History  Substance Use Topics  . Smoking status: Never Smoker  . Smokeless tobacco: Never Used  . Alcohol use No     Medication list has been reviewed and updated.   Physical Exam  Constitutional: He is oriented to person, place, and time. He appears well-developed and well-nourished.  HENT:  Right Ear: External ear and ear canal normal. Tympanic membrane is not erythematous and not retracted.  Left Ear:  External ear and ear canal normal. Tympanic membrane is not erythematous and not retracted.  Nose: Right sinus exhibits no maxillary sinus tenderness and no frontal sinus tenderness. Left sinus exhibits no maxillary sinus tenderness and no frontal sinus tenderness.  Mouth/Throat: Uvula is midline and mucous membranes are normal. No oral lesions. Posterior oropharyngeal erythema present. No oropharyngeal exudate.  Cardiovascular: Normal rate, regular rhythm and normal heart sounds.   Pulmonary/Chest: He has decreased breath sounds. He has no wheezes. He has no rhonchi. He has no rales.  Lymphadenopathy:    He has no cervical adenopathy.  Neurological: He is alert and oriented to person, place, and time.    BP (!) 148/96   Pulse 88   Temp 97.6 F (36.4 C)   Ht 5\' 11"  (1.803 m)   Wt 172 lb (78 kg)   SpO2 98%   BMI 23.99 kg/m   Assessment and Plan: 1. Bronchitis And possibly chronic sinusitis Consider ENT evaluation if no improvement - cefdinir (OMNICEF) 300 MG capsule; Take 1 capsule (300 mg total) by mouth 2 (two) times daily.  Dispense: 20 capsule; Refill: 0 - HYDROcodone-homatropine (HYCODAN) 5-1.5 MG/5ML syrup; Take 5 mLs by mouth every 6 (six) hours as needed for cough.  Dispense: 120 mL; Refill: 0   Halina Maidens, MD [facility]  06/06/2016

## 2016-06-09 ENCOUNTER — Telehealth: Payer: Self-pay | Admitting: Internal Medicine

## 2016-06-09 NOTE — Telephone Encounter (Signed)
Pt called need more few days antibiotic.Marland KitchenMarland Kitchen

## 2016-06-09 NOTE — Telephone Encounter (Signed)
I'm not giving him more antibiotics until he finishes what he has.

## 2016-06-12 NOTE — Telephone Encounter (Signed)
Called pt to finishes all antibiotic before getting more refill by Dr. Army Melia.

## 2016-06-30 ENCOUNTER — Telehealth: Payer: Self-pay | Admitting: Internal Medicine

## 2016-07-13 NOTE — Telephone Encounter (Signed)
error 

## 2016-09-20 ENCOUNTER — Telehealth: Payer: Self-pay

## 2016-09-20 NOTE — Telephone Encounter (Signed)
Pt called stating his L) foot at the bottom of his toes are swollen. They are red and inflamed and he states he cannot get his shoes on. He said this is been going on for 3 days now and he is concerned he may have stepped on spider out in his yard. After consulting with Dr. Army Melia, I told pt to go be seen at [facility] today to get foot checked out.

## 2016-09-29 ENCOUNTER — Telehealth: Payer: Self-pay

## 2016-09-29 NOTE — Telephone Encounter (Signed)
ERROR

## 2016-10-05 ENCOUNTER — Telehealth: Payer: Self-pay

## 2016-10-05 NOTE — Telephone Encounter (Signed)
Called pt, pt is scheduled for appt on 10/06/16.

## 2016-10-06 ENCOUNTER — Encounter: Payer: Self-pay | Admitting: Internal Medicine

## 2016-10-06 ENCOUNTER — Ambulatory Visit (INDEPENDENT_AMBULATORY_CARE_PROVIDER_SITE_OTHER): Payer: 59 | Admitting: Internal Medicine

## 2016-10-06 VITALS — BP 122/86 | HR 78 | Temp 98.0°F | Ht 71.0 in | Wt 175.0 lb

## 2016-10-06 DIAGNOSIS — Q984 Klinefelter syndrome, unspecified: Secondary | ICD-10-CM | POA: Diagnosis not present

## 2016-10-06 DIAGNOSIS — J029 Acute pharyngitis, unspecified: Secondary | ICD-10-CM | POA: Diagnosis not present

## 2016-10-06 MED ORDER — AMOXICILLIN 875 MG PO TABS: 875.0000 mg | ORAL_TABLET | Freq: Two times a day (BID) | ORAL | 0 refills | Status: DC

## 2016-10-06 MED ORDER — TESTOSTERONE 50 MG/5GM (1%) TD GEL: 10.0000 g | Freq: Every day | TRANSDERMAL | 1 refills | Status: DC

## 2016-10-06 NOTE — Progress Notes (Signed)
Date:  10/06/2016   Name:  Joseph Chung   DOB:  02/02/1961   MRN:  [id number]   Chief Complaint: Sore Throat (hurts in front of throat. When sneezing or blow nose its painful. When coughing "I drop to my knees it hurts so bad." LAst time ended up at hospital for this pain) Sore Throat   This is a chronic problem. The current episode started more than 1 month ago. The problem has been unchanged. Neither side of throat is experiencing more pain than the other. There has been no fever. The pain is moderate. Pertinent negatives include no coughing or diarrhea.  It hurts to sneeze and cough.  No pain with chewing, talking or swallowing.  He denies any injury.  He does have post nasal drip.  He has been having dental implants placed - a gradual process.    Review of Systems  Constitutional: Negative for chills and fever.  HENT: Positive for sore throat.   Respiratory: Negative for cough, chest tightness and wheezing.   Cardiovascular: Negative for chest pain and palpitations.  Gastrointestinal: Negative for diarrhea.  Skin: Positive for wound (large painful warts on multiple fingers).    Patient Active Problem List   Diagnosis Date Noted  . Serrated adenoma of colon 04/05/2016  . Elbow injury 09/29/2015  . Hx of adenomatous colonic polyps 11/20/2014  . Allergic rhinitis 11/19/2014  . AD (atopic dermatitis) 11/19/2014  . 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 Dyslipidemia 11/19/2014  . Acid reflux 11/19/2014  . Hemangioma of liver 11/19/2014  . Herpes 11/19/2014  . Irritable bowel syndrome with diarrhea 11/19/2014  . Klinefelter syndrome 11/19/2014  . Acquired cyst of kidney 11/19/2014  . Headache, tension-type 11/19/2014  . H/O arthrodesis 02/05/2013  . Common wart 02/05/2013    Prior to Admission medications   Medication Sig Start Date End Date Taking? Authorizing Provider  acyclovir cream (ZOVIRAX) 5 % Apply 1 application topically 2 (two) times  daily. 06/04/15  Yes Glean Hess, MD  aspirin EC 81 MG tablet Take 1 tablet by mouth every other day.    Yes Historical Provider, MD  Biotin 10 MG TABS Take by mouth.   Yes Historical Provider, MD  CHARCOAL ACTIVATED PO Take by mouth.   Yes Historical Provider, MD  Coconut Oil 1000 MG CAPS Take by mouth.   Yes Historical Provider, MD  milk thistle 175 MG tablet Take 175 mg by mouth daily.   Yes Historical Provider, MD  niacin (NIASPAN) 1000 MG CR tablet Take 2 tablets (2,000 mg total) by mouth daily. 06/04/15  Yes Glean Hess, MD  olive oil external oil Apply topically as needed.   Yes Historical Provider, MD  Omega-3 Fatty Acids (FISH OIL) 1000 MG CAPS Take 2 capsules (2,000 mg total) by mouth 2 (two) times daily. 06/04/15  Yes Glean Hess, MD  ondansetron (ZOFRAN) 8 MG tablet Take 1 tablet (8 mg total) by mouth every 8 (eight) hours as needed for nausea or vomiting. 04/05/16  Yes Glean Hess, MD  testosterone (ANDROGEL) 50 MG/5GM (1%) GEL Place 10 g onto the skin daily. 09/10/15  Yes Glean Hess, MD    Allergies  Allergen Reactions  . Codeine Nausea And Vomiting and Nausea Only    Patient states he feels "near death" when taking  . Levofloxacin Nausea And Vomiting    Past Surgical History:  Procedure Laterality Date  . APPENDECTOMY    . CERVICAL [place] SURGERY  2007    Social History  Substance Use Topics  . Smoking status: Never Smoker  . Smokeless tobacco: Never Used  . Alcohol use No     Medication list has been reviewed and updated.   Physical Exam  Constitutional: He is oriented to person, place, and time. He appears well-developed. No distress.  HENT:  Head: Normocephalic and atraumatic.  Right Ear: Tympanic membrane and ear canal normal.  Left Ear: Tympanic membrane and ear canal normal.  Nose: Right sinus exhibits no maxillary sinus tenderness. Left sinus exhibits no maxillary sinus tenderness.  Mouth/Throat: No posterior oropharyngeal edema  or posterior oropharyngeal erythema.  Cardiovascular: Normal rate, regular rhythm and normal heart sounds.   Pulmonary/Chest: Effort normal and breath sounds normal. No respiratory distress.  Musculoskeletal: He exhibits no edema.  Left TMJ click  Neurological: He is alert and oriented to person, place, and time.  Skin: Skin is warm and dry. Rash (warts in fingers) noted.  Psychiatric: He has a normal mood and affect. His behavior is normal. Thought content normal.  Nursing note and vitals reviewed.   BP 122/86 (BP Location: Right Arm, Patient Position: Sitting, Cuff Size: Normal)   Pulse 78   Temp 98 F (36.7 C)   Ht 5\' 11"  (1.803 m)   Wt 175 lb (79.4 kg)   SpO2 99%   BMI 24.41 kg/m   Assessment and Plan: 1. Pharyngitis, unspecified etiology Will treat for bacterial infection If no resolution, refer to ENT  2. Klinefelter syndrome  Meds ordered this encounter  Medications  . amoxicillin (AMOXIL) 875 MG tablet    Sig: Take 1 tablet (875 mg total) by mouth 2 (two) times daily.    Dispense:  20 tablet    Refill:  0  . testosterone (ANDROGEL) 50 MG/5GM (1%) GEL    Sig: Place 10 g onto the skin daily.    Dispense:  180 Package    Refill:  [place], MD [facility]  10/06/2016

## 2016-12-15 ENCOUNTER — Ambulatory Visit (INDEPENDENT_AMBULATORY_CARE_PROVIDER_SITE_OTHER): Payer: 59 | Admitting: Internal Medicine

## 2016-12-15 ENCOUNTER — Ambulatory Visit
Admission: RE | Admit: 2016-12-15 | Discharge: 2016-12-15 | Disposition: A | Discharge status: Home / Self Care | Payer: 59 | Source: Ambulatory Visit | Attending: Internal Medicine | Admitting: Internal Medicine

## 2016-12-15 ENCOUNTER — Encounter: Payer: Self-pay | Admitting: Internal Medicine

## 2016-12-15 ENCOUNTER — Telehealth: Payer: Self-pay

## 2016-12-15 VITALS — BP 136/84 | HR 88 | Temp 97.6°F | Ht 71.0 in | Wt 173.0 lb

## 2016-12-15 DIAGNOSIS — R0602 Shortness of breath: Secondary | ICD-10-CM

## 2016-12-15 DIAGNOSIS — J3089 Other allergic rhinitis: Secondary | ICD-10-CM

## 2016-12-15 DIAGNOSIS — K219 Gastro-esophageal reflux disease without esophagitis: Secondary | ICD-10-CM

## 2016-12-15 MED ORDER — MONTELUKAST SODIUM 10 MG PO TABS: 10.0000 mg | ORAL_TABLET | Freq: Every day | ORAL | 0 refills | Status: DC

## 2016-12-15 MED ORDER — TESTOSTERONE 50 MG/5GM (1%) TD GEL: 10.0000 g | Freq: Every day | TRANSDERMAL | 1 refills | Status: DC

## 2016-12-15 NOTE — Progress Notes (Signed)
Date:  12/15/2016   Name:  Joseph Chung   DOB:  Aug 22, 1960   MRN:  [id number]   Chief Complaint: Shortness of Breath (Feels SOB- most at night. No matter what he does can't get a full breathe in. Nasal is stuffed up at night- taking sudafed at night to try and give relief. Not better. Coughing when takes deep breathe.  ) He has trouble getting a deep breath - usually worse in the evening and at night.  He denies wheezing.  He denies any new pets - but does have three dogs but they are not in the bedroom.  He sometimes has a dry cough. He also has nasal congestion that is improved with sudafed.  He has no hx of asthma but his wife was just diagnosed with asthma.  He has not used albuterol inhaler. He sleeps on 2 pillows due to neck stiffness and pain.  He take omeprazole daily for GERD and feels that his sx are well controlled. He has had more allergic sx with nasal congestion over the past few years. Not on any daily medications. 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  He is due for medication refill.  Review of Systems  Constitutional: Negative for chills, fatigue and fever.  Respiratory: Positive for chest tightness and shortness of breath. Negative for choking, wheezing and stridor.   Cardiovascular: Negative for chest pain, palpitations and leg swelling.    Patient Active Problem List   Diagnosis Date Noted  . Serrated adenoma of colon 04/05/2016  . Elbow injury 09/29/2015  . Hx of adenomatous colonic polyps 11/20/2014  . Allergic rhinitis 11/19/2014  . AD (atopic dermatitis) 11/19/2014  . 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 Dyslipidemia 11/19/2014  . Acid reflux 11/19/2014  . Hemangioma of liver 11/19/2014  . Herpes 11/19/2014  . Irritable bowel syndrome with diarrhea 11/19/2014  . Klinefelter syndrome 11/19/2014  . Acquired cyst of kidney 11/19/2014  . Headache, tension-type 11/19/2014  . H/O arthrodesis 02/05/2013  . Common  wart 02/05/2013    Prior to Admission medications   Medication Sig Start Date End Date Taking? Authorizing Provider  acyclovir cream (ZOVIRAX) 5 % Apply 1 application topically 2 (two) times daily. 06/04/15  Yes Glean Hess, MD  aspirin EC 81 MG tablet Take 1 tablet by mouth every other day.    Yes [provider]  Biotin 10 MG TABS Take by mouth.   Yes [provider]  Coconut Oil 1000 MG CAPS Take by mouth.   Yes [provider]  milk thistle 175 MG tablet Take 175 mg by mouth daily.   Yes [provider]  niacin (NIASPAN) 1000 MG CR tablet Take 2 tablets (2,000 mg total) by mouth daily. 06/04/15  Yes Glean Hess, MD  olive oil external oil Apply topically as needed.   Yes [provider]  Omega-3 Fatty Acids (FISH OIL) 1000 MG CAPS Take 2 capsules (2,000 mg total) by mouth 2 (two) times daily. 06/04/15  Yes Glean Hess, MD  testosterone (ANDROGEL) 50 MG/5GM (1%) GEL Place 10 g onto the skin daily. 10/06/16  Yes Glean Hess, MD    Allergies  Allergen Reactions  . Codeine Nausea And Vomiting and Nausea Only    Patient states he feels "near death" when taking  . Levofloxacin Nausea And Vomiting    Past Surgical History:  Procedure Laterality Date  . APPENDECTOMY    . [place] SURGERY  2007  Social History  Substance Use Topics  . Smoking status: Never Smoker  . Smokeless tobacco: Never Used  . Alcohol use No     Medication list has been reviewed and updated.   Physical Exam  Constitutional: No distress.  Neck: Normal range of motion. Neck supple. No thyromegaly present.  Cardiovascular: Normal rate, regular rhythm and normal heart sounds.   Pulmonary/Chest: Effort normal. No respiratory distress. He has no wheezes. He has no rhonchi.  Slightly coarse breath sounds both upper lungs  Psychiatric: He has a normal mood and affect.    BP 136/84   Pulse 88   Temp 97.6 F (36.4 C)   Ht 5\' 11"   (1.803 m)   Wt 173 lb (78.5 kg)   SpO2 97%   BMI 24.13 kg/m   Assessment and Plan: 1. Shortness of breath Suspect allergic etiology - DG Chest 2 View; Future - Comprehensive metabolic panel - TSH  2. Allergic rhinitis due to other allergic trigger, unspecified seasonality Add singulair - this will help with allergies and also asthmatic component if present - montelukast (SINGULAIR) 10 MG tablet; Take 1 tablet (10 mg total) by mouth at bedtime.  Dispense: 30 tablet; Refill: 0  3. Gastroesophageal reflux disease without esophagitis Continue daily PPI - CBC with Differential/Platelet   Meds ordered this encounter  Medications  . montelukast (SINGULAIR) 10 MG tablet    Sig: Take 1 tablet (10 mg total) by mouth at bedtime.    Dispense:  30 tablet    Refill:  0  . testosterone (ANDROGEL) 50 MG/5GM (1%) GEL    Sig: Place 10 g onto the skin daily.    Dispense:  180 Package    Refill:  [place], MD [facility]  12/15/2016

## 2016-12-15 NOTE — Telephone Encounter (Signed)
Pt called stating he wants to schedule appt- thinks his sinus infection is back- congested; nose is stopped up worse at night- been taking sudafed for some relief. Also, having some difficulty catching his breath. Sent pt to front desk to be seen for poss pnuemonia?-

## 2016-12-16 LAB — CBC WITH DIFFERENTIAL/PLATELET
BASOS: 1 %
Basophils Absolute: 0 10*3/uL (ref 0.0–0.2)
EOS (ABSOLUTE): 0.1 10*3/uL (ref 0.0–0.4)
Eos: 1 %
Hematocrit: 44.5 % (ref 37.5–51.0)
Hemoglobin: 15.2 g/dL (ref 13.0–17.7)
Immature Grans (Abs): 0 10*3/uL (ref 0.0–0.1)
Immature Granulocytes: 0 %
LYMPHS ABS: 1.2 10*3/uL (ref 0.7–3.1)
Lymphs: 19 %
MCH: 28.5 pg (ref 26.6–33.0)
MCHC: 34.2 g/dL (ref 31.5–35.7)
MCV: 84 fL (ref 79–97)
MONOS ABS: 0.5 10*3/uL (ref 0.1–0.9)
Monocytes: 8 %
NEUTROS ABS: 4.5 10*3/uL (ref 1.4–7.0)
Neutrophils: 71 %
PLATELETS: 174 10*3/uL (ref 150–379)
RBC: 5.33 x10E6/uL (ref 4.14–5.80)
RDW: 14.6 % (ref 12.3–15.4)
WBC: 6.3 10*3/uL (ref 3.4–10.8)

## 2016-12-16 LAB — COMPREHENSIVE METABOLIC PANEL
A/G RATIO: 1.7 (ref 1.2–2.2)
ALT: 41 IU/L (ref 0–44)
AST: 22 IU/L (ref 0–40)
Albumin: 4.7 g/dL (ref 3.5–5.5)
Alkaline Phosphatase: 88 IU/L (ref 39–117)
BILIRUBIN TOTAL: 0.5 mg/dL (ref 0.0–1.2)
BUN/Creatinine Ratio: 11 (ref 9–20)
BUN: 10 mg/dL (ref 6–24)
CHLORIDE: 101 mmol/L (ref 96–106)
CO2: 28 mmol/L (ref 20–29)
Calcium: 10.4 mg/dL — ABNORMAL HIGH (ref 8.7–10.2)
Creatinine, Ser: 0.94 mg/dL (ref 0.76–1.27)
GFR calc non Af Amer: 91 mL/min/{1.73_m2} (ref >59)
GFR, EST AFRICAN AMERICAN: 105 mL/min/{1.73_m2} (ref >59)
Globulin, Total: 2.7 g/dL (ref 1.5–4.5)
Glucose: 119 mg/dL — ABNORMAL HIGH (ref 65–99)
POTASSIUM: 4.1 mmol/L (ref 3.5–5.2)
SODIUM: 145 mmol/L — AB (ref 134–144)
Total Protein: 7.4 g/dL (ref 6.0–8.5)

## 2016-12-16 LAB — TSH: TSH: 2.25 u[IU]/mL (ref 0.450–4.500)

## 2016-12-26 ENCOUNTER — Telehealth: Payer: Self-pay

## 2016-12-26 NOTE — Telephone Encounter (Signed)
Pt calling stating Singulair pills are too large to swallow. Having to crush them to take them. Pt asking if there is something else or liquid form?- Please advise.

## 2016-12-26 NOTE — Telephone Encounter (Signed)
It is fine to crush them up to take them.  Can also chew them - there is no liquid formulation.

## 2016-12-26 NOTE — Telephone Encounter (Signed)
Pt informed can chew or crush up. No child form of meds he can take or no liquid form.

## 2017-03-26 ENCOUNTER — Telehealth: Payer: Self-pay

## 2017-03-26 NOTE — Telephone Encounter (Signed)
He can absolutely take Singular every day.  If he needs a refill, let me know.

## 2017-03-26 NOTE — Telephone Encounter (Signed)
Patient called stating he is again having some issues with his breathing. Was last seen in July for this. Most likely due to weather it is flaring up again. Singulair helped him last time and want to know if he can continue to take Singulair daily. Need appt or send in Rx? Informed him Dr Army Melia is out today sick but may be back tomorrow and will call him when she returns. He stated this is fine. Please Advise.

## 2017-03-27 ENCOUNTER — Other Ambulatory Visit: Payer: Self-pay

## 2017-03-27 DIAGNOSIS — J3089 Other allergic rhinitis: Secondary | ICD-10-CM

## 2017-03-27 MED ORDER — MONTELUKAST SODIUM 10 MG PO TABS: 10.0000 mg | ORAL_TABLET | Freq: Every day | ORAL | 5 refills | Status: DC

## 2017-03-27 NOTE — Telephone Encounter (Signed)
Spoke with patient and informed its okay to take Singulair daily. Sent in patient Rx to [facility] in [place] [facility] with 5 refills. Patient stated he had a rough night last night where he woke up in his sleep having some difficulty breathing. Informed him to try taking the Singulair daily. If no relief after a few days give [place] a call and we may need to see him for appt. He verbalized understanding.

## 2017-05-31 ENCOUNTER — Telehealth: Payer: Self-pay

## 2017-05-31 ENCOUNTER — Other Ambulatory Visit: Payer: Self-pay | Admitting: Internal Medicine

## 2017-05-31 MED ORDER — OMEPRAZOLE 20 MG PO CPDR: 20.0000 mg | DELAYED_RELEASE_CAPSULE | Freq: Every day | ORAL | 3 refills | Status: DC

## 2017-05-31 MED ORDER — TESTOSTERONE 50 MG/5GM (1%) TD GEL: 10.0000 g | Freq: Every day | TRANSDERMAL | 1 refills | Status: DC

## 2017-05-31 NOTE — Telephone Encounter (Signed)
Patient called stating he needs to refills. Would like omeprazole sent in. He says he only uses this as needed and has not had since last year- just about to run out. Also, refill on Androgel. Please Advise.

## 2017-07-29 IMAGING — CR DG ELBOW COMPLETE 3+V*R*
5 series · 5 of 5 positions shown · non-contrast
Comparison: None.

CLINICAL DATA: Status post fall down stairs yesterday striking the
right elbow ; symptoms are centered over the olecranon process.

EXAM:
RIGHT ELBOW - COMPLETE 3+ VIEW

[elbow ap]
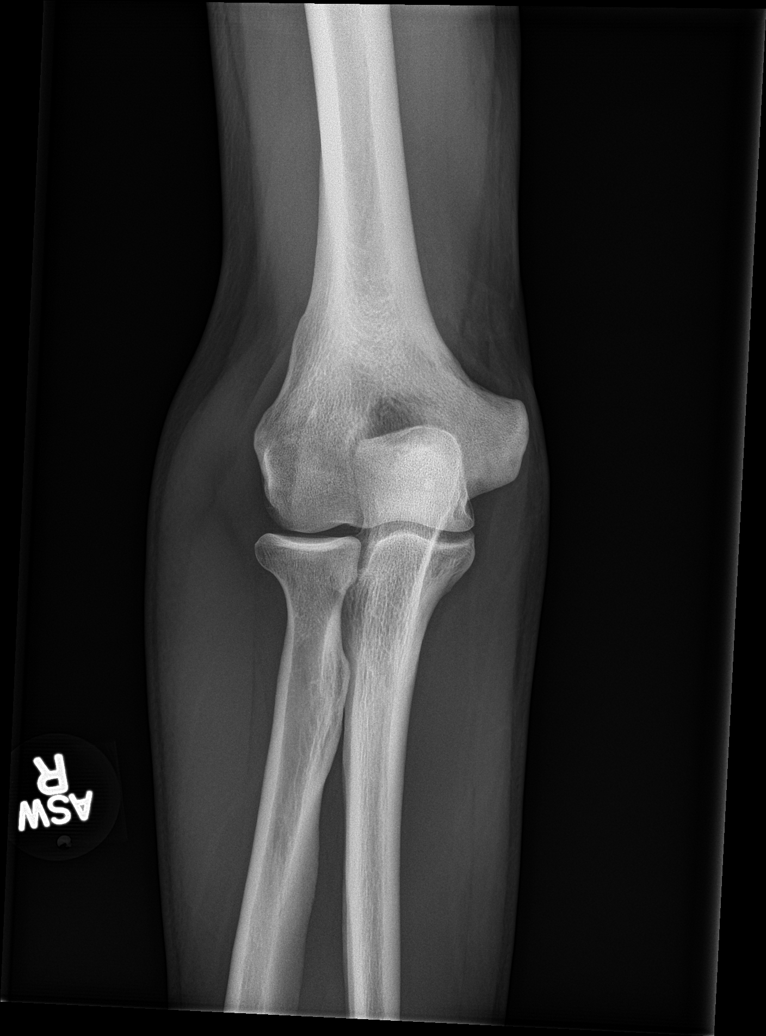

[elbow obl (1 of 3)]
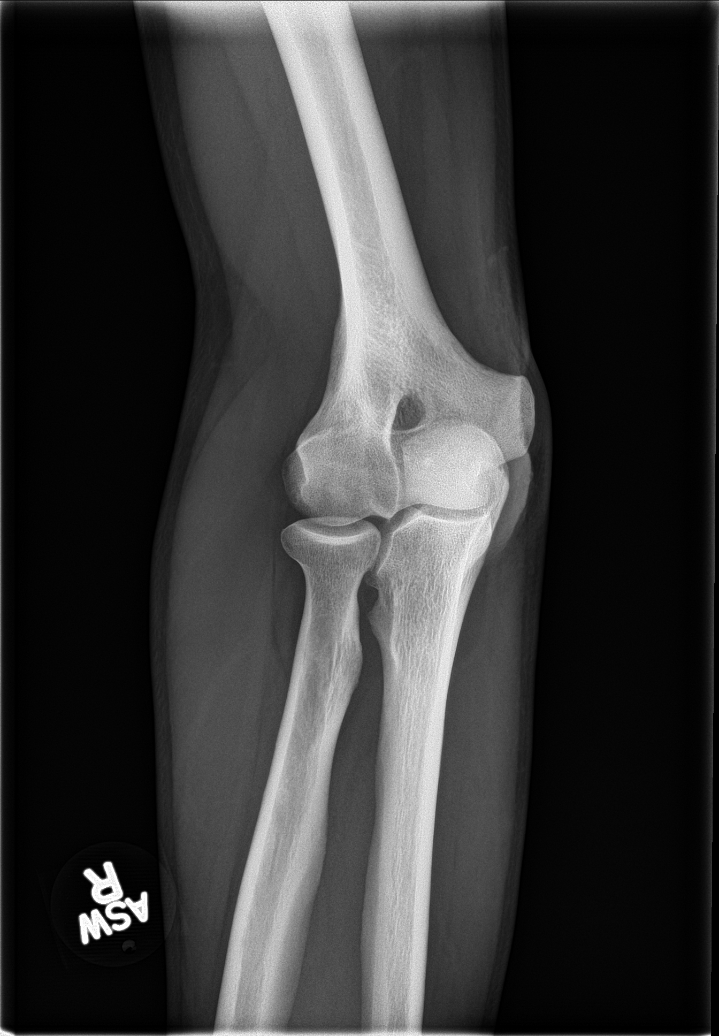

[elbow lat]
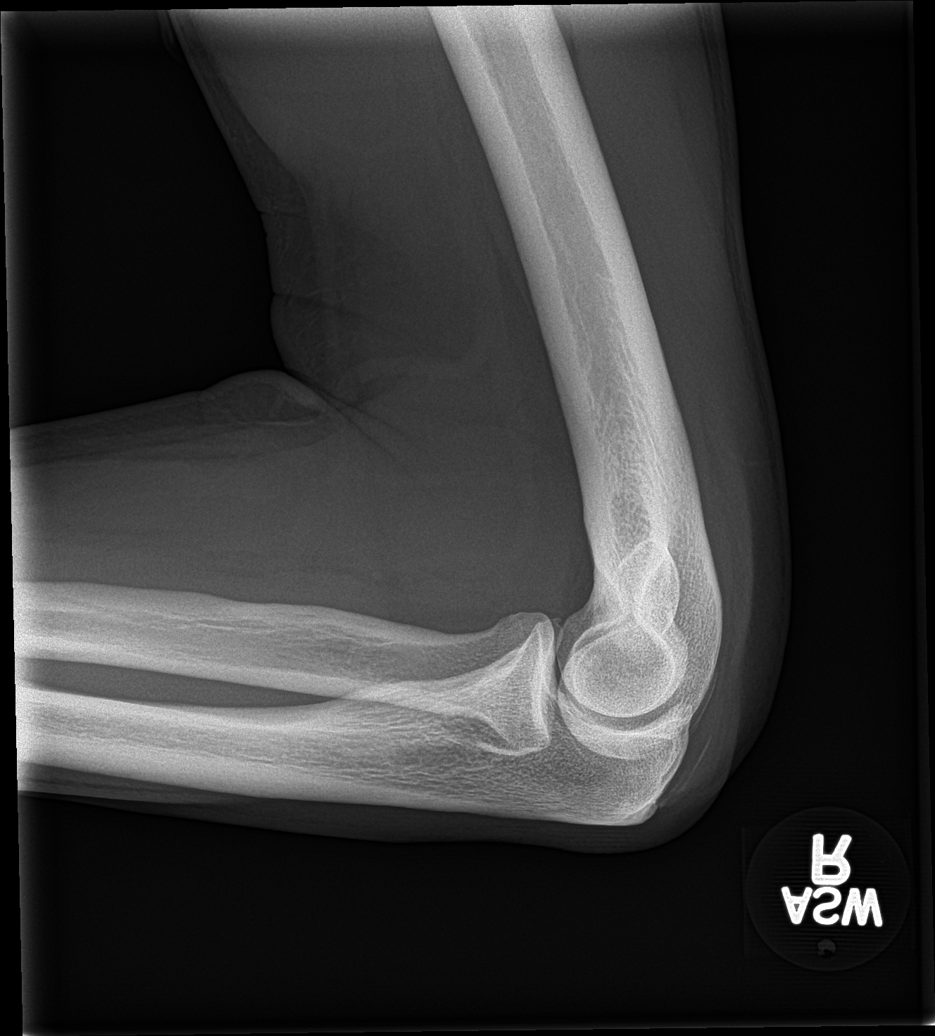

[elbow obl (2 of 3)]
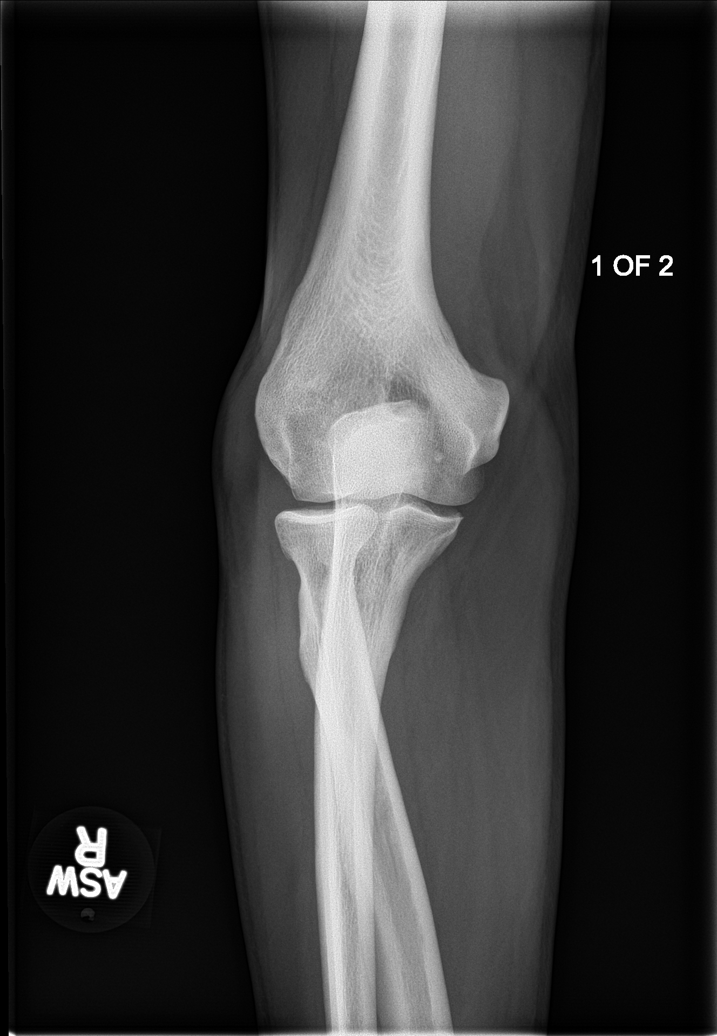

[elbow obl (3 of 3)]
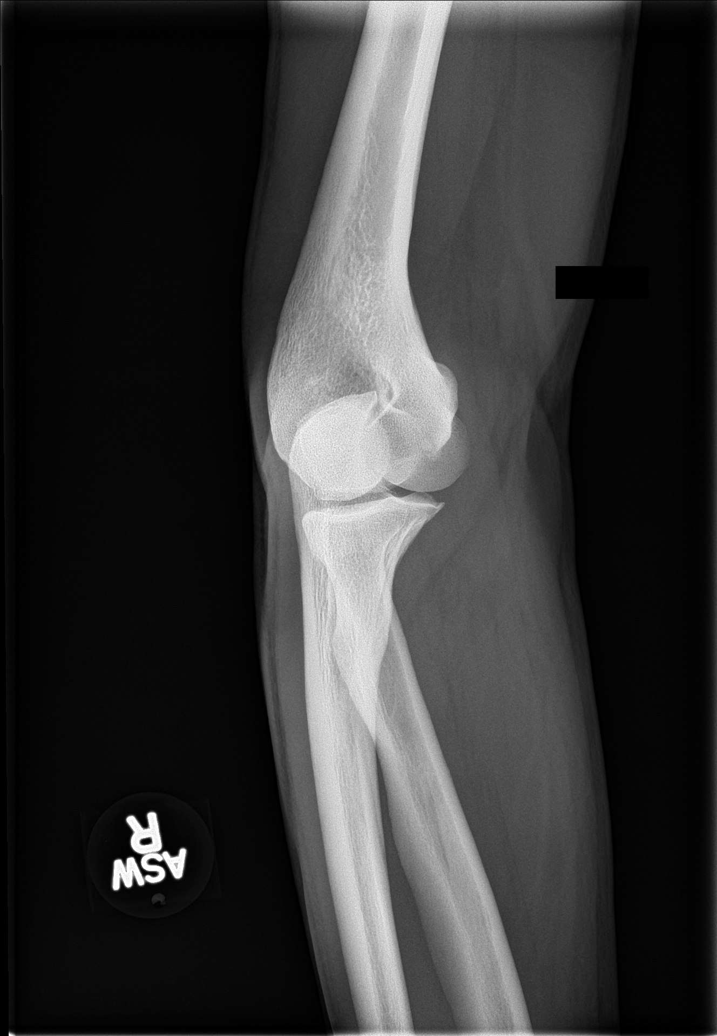

[5 of 5 positions shown; findings below may reference images not displayed]

FINDINGS: The bones are adequately mineralized. There is no acute fracture.
There is mild soft tissue swelling over the olecranon process. There
is a tiny spur arising from the trochlea of the olecranon. The
radial head is intact. The condylar and supracondylar portions of
the distal humerus are intact
IMPRESSION: There is no acute fracture nor dislocation. There is soft tissue
swelling over the olecranon consistent with contusion. There are
mild degenerative changes elsewhere.

## 2017-08-06 ENCOUNTER — Ambulatory Visit: Payer: 59 | Admitting: Internal Medicine

## 2017-08-06 ENCOUNTER — Encounter: Payer: Self-pay | Admitting: Internal Medicine

## 2017-08-06 VITALS — BP 148/104 | HR 118 | Temp 98.7°F | Ht 71.0 in | Wt 179.0 lb

## 2017-08-06 DIAGNOSIS — R6889 Other general symptoms and signs: Secondary | ICD-10-CM | POA: Diagnosis not present

## 2017-08-06 LAB — POCT INFLUENZA A/B
Influenza A, POC: NEGATIVE
Influenza B, POC: NEGATIVE

## 2017-08-06 MED ORDER — OSELTAMIVIR PHOSPHATE 75 MG PO CAPS: 75.0000 mg | ORAL_CAPSULE | Freq: Two times a day (BID) | ORAL | 0 refills | Status: AC

## 2017-08-06 NOTE — Progress Notes (Signed)
Date:  08/06/2017   Name:  Joseph Chung   DOB:  [birth year]   MRN:  [id number]   Chief Complaint: Generalized Body Aches (SOB, tried sudafed lastnight and ibuprofen. Feverish with chills. Body aches, coughing, sneezing, headache. )  URI   This is a new problem. The current episode started yesterday. The problem has been unchanged. Associated symptoms include coughing and headaches. Pertinent negatives include no chest pain or wheezing. He has tried decongestant for the symptoms. The treatment provided no relief.     Review of Systems  Constitutional: Positive for chills, fatigue and fever.  Respiratory: Positive for cough and shortness of breath. Negative for chest tightness and wheezing.   Cardiovascular: Negative for chest pain and palpitations.  Musculoskeletal: Positive for myalgias.  Neurological: Positive for headaches.  Psychiatric/Behavioral: Positive for sleep disturbance. Negative for dysphoric mood.    Patient Active Problem List   Diagnosis Date Noted  . Serrated adenoma of colon 04/05/2016  . Elbow injury 09/29/2015  . Hx of adenomatous colonic polyps 11/20/2014  . Allergic rhinitis 11/19/2014  . AD (atopic dermatitis) 11/19/2014  . 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 Dyslipidemia 11/19/2014  . Acid reflux 11/19/2014  . Hemangioma of liver 11/19/2014  . Herpes 11/19/2014  . Irritable bowel syndrome with diarrhea 11/19/2014  . Klinefelter syndrome 11/19/2014  . Acquired cyst of kidney 11/19/2014  . Headache, tension-type 11/19/2014  . H/O arthrodesis 02/05/2013  . Common wart 02/05/2013    Prior to Admission medications   Medication Sig Start Date End Date Taking? Authorizing Provider  acyclovir cream (ZOVIRAX) 5 % Apply 1 application topically 2 (two) times daily. 06/04/15  Yes Glean Hess, MD  aspirin EC 81 MG tablet Take 1 tablet by mouth every other day.    Yes [provider]  Biotin 10 MG TABS Take by mouth.    Yes [provider]  Coconut Oil 1000 MG CAPS Take by mouth.   Yes [provider]  milk thistle 175 MG tablet Take 175 mg by mouth daily.   Yes [provider]  montelukast (SINGULAIR) 10 MG tablet Take 1 tablet (10 mg total) by mouth at bedtime. 03/27/17  Yes Glean Hess, MD  niacin (NIASPAN) 1000 MG CR tablet Take 2 tablets (2,000 mg total) by mouth daily. 06/04/15  Yes Glean Hess, MD  olive oil external oil Apply topically as needed.   Yes [provider]  Omega-3 Fatty Acids (FISH OIL) 1000 MG CAPS Take 2 capsules (2,000 mg total) by mouth 2 (two) times daily. 06/04/15  Yes Glean Hess, MD  omeprazole (PRILOSEC) 20 MG capsule Take 1 capsule (20 mg total) by mouth daily. 05/31/17  Yes Glean Hess, MD  testosterone (ANDROGEL) 50 MG/5GM (1%) GEL Place 10 g onto the skin daily. 05/31/17  Yes Glean Hess, MD    Allergies  Allergen Reactions  . Codeine Nausea And Vomiting and Nausea Only    Patient states he feels "near death" when taking  . Levofloxacin Nausea And Vomiting    Past Surgical History:  Procedure Laterality Date  . APPENDECTOMY    . CERVICAL DISC SURGERY  2007    Social History   Tobacco Use  . Smoking status: Never Smoker  . Smokeless tobacco: Never Used  Substance Use Topics  . Alcohol use: No    Alcohol/week: 0.0 oz  . Drug use: No     Medication list has been reviewed and updated.  PHQ 2/9 Scores 08/06/2017  PHQ - 2 Score 0  PHQ- 9 Score 0    Physical Exam  Constitutional: He has a sickly appearance.  HENT:  Nose: Right sinus exhibits no maxillary sinus tenderness. Left sinus exhibits no maxillary sinus tenderness.  Neck: No tracheal tenderness present.  Cardiovascular: Regular rhythm and normal heart sounds. Tachycardia present.  Pulmonary/Chest: Breath sounds normal. He has no wheezes. He has no rhonchi.    BP (!) 148/104   Pulse (!) 118   Temp 98.7 F (37.1 C) (Oral)   Ht 5'  11" (1.803 m)   Wt 179 lb (81.2 kg)   SpO2 100%   BMI 24.97 kg/m   Assessment and Plan: 1. Flu-like symptoms Continue fluids and cough suppressants - oseltamivir (TAMIFLU) 75 MG capsule; Take 1 capsule (75 mg total) by mouth 2 (two) times daily for 5 days.  Dispense: 10 capsule; Refill: 0   Meds ordered this encounter  Medications  . oseltamivir (TAMIFLU) 75 MG capsule    Sig: Take 1 capsule (75 mg total) by mouth 2 (two) times daily for 5 days.    Dispense:  10 capsule    Refill:  0    Partially dictated using [place], [place]. Any errors are unintentional.  Halina Maidens, MD [facility]  08/06/2017

## 2017-08-09 ENCOUNTER — Telehealth: Payer: Self-pay

## 2017-08-09 NOTE — Telephone Encounter (Signed)
Patient called stating he feels like getting worse before getting better. Having trouble sleeping, and nose stopped up to where he cannot breathe while laying down. Informed Dr Army Melia- told patient she stated that unfortunately since its the flu it will have to run its course. Sleep while elevated with a pillow or up on couch or recliner until congestion clears up some. He verbalized understanding. Also, asked if he can take cough medication with sudafed. I told him as long as the ingredients are not the same he can take them together. He verbalized understanding of this.

## 2017-12-01 ENCOUNTER — Other Ambulatory Visit: Payer: Self-pay | Admitting: Internal Medicine

## 2017-12-01 DIAGNOSIS — J3089 Other allergic rhinitis: Secondary | ICD-10-CM

## 2018-01-08 ENCOUNTER — Telehealth: Payer: Self-pay

## 2018-01-08 NOTE — Telephone Encounter (Signed)
Wants order for Hep A sent to [facility] . Travels to [place] soon.

## 2018-01-09 NOTE — Telephone Encounter (Signed)
I already spoke with the patient about this previously. He was told to contact the pharmacy or health department and have vaccines done himself. Dr Army Melia will not do orders for these as they are not necessary. Please inform patient of this.

## 2018-01-09 NOTE — Telephone Encounter (Signed)
I do not need to order the Hep A.  He can go and ask for it himself.

## 2018-01-09 NOTE — Telephone Encounter (Signed)
Pharmacy told him Hep B he can get on his own but they have new vaccine with Hep A and Hep B combo one dose now and need Rx for Hep A to get this [facility] told him

## 2018-01-16 ENCOUNTER — Telehealth: Payer: Self-pay

## 2018-01-16 NOTE — Telephone Encounter (Signed)
Patient called and left VM stating he is still waiting on orders for Hep A and B vaccines to be sent over to [facility]. He wants these vaccines because he going to be visiting [place] in a few months and his sister in law has Hep C and he refuses to visit in [place] if he does not have Hep A and B vaccines.  Called patient back and was unable to reach him but left VM in formed we can not send over any orders for these vaccines because it is not necessary to prevent him from getting Hep C. Informed him Hep A and B is completely different diseases. There is not a Hep C vaccine and he cannot get Hep C just by sitting in the room with his sister in law. Told him to call back if any questions but we will NOT be sending over any orders to [facility].

## 2018-01-24 ENCOUNTER — Other Ambulatory Visit: Payer: Self-pay | Admitting: Internal Medicine

## 2018-01-24 ENCOUNTER — Telehealth: Payer: Self-pay

## 2018-01-24 DIAGNOSIS — Z7184 Encounter for health counseling related to travel: Secondary | ICD-10-CM

## 2018-01-24 NOTE — Telephone Encounter (Signed)
Patient called stating he would like a combination vaccine for Hep A and B order sent to [facility] in [place]. I did inform him that this is not necessary to have just because his sister in law has Hep C. Informed him we cannot send a order if its not medically necessary. He stated " is it not my right to call my doctor and request a order to be sent to my pharmacy if I want it?" He stated he doesn't want this because his sister in law has Hep C but because he wants it for a preventative measure since he is traveling to [place] in the next few months.  Please Advise.

## 2018-01-24 NOTE — Telephone Encounter (Signed)
Sent the Twinrix to [facility] as requested.

## 2018-01-25 NOTE — Telephone Encounter (Signed)
Patient informed. 

## 2018-02-27 ENCOUNTER — Encounter: Payer: Self-pay | Admitting: Internal Medicine

## 2018-02-27 ENCOUNTER — Ambulatory Visit: Payer: 59 | Admitting: Internal Medicine

## 2018-02-27 VITALS — BP 132/80 | HR 74 | Ht 71.0 in | Wt 163.0 lb

## 2018-02-27 DIAGNOSIS — K580 Irritable bowel syndrome with diarrhea: Secondary | ICD-10-CM

## 2018-02-27 DIAGNOSIS — D126 Benign neoplasm of colon, unspecified: Secondary | ICD-10-CM

## 2018-02-27 MED ORDER — ONDANSETRON 8 MG PO TBDP: 8.0000 mg | ORAL_TABLET | Freq: Three times a day (TID) | ORAL | 2 refills | Status: DC | PRN

## 2018-02-27 MED ORDER — DICYCLOMINE HCL 10 MG PO CAPS: 10.0000 mg | ORAL_CAPSULE | Freq: Every day | ORAL | 2 refills | Status: DC | PRN

## 2018-02-27 NOTE — Progress Notes (Signed)
Date:  02/27/2018   Name:  Joseph Chung   DOB:  December 11, 1960   MRN:  [id number]   Chief Complaint: Abdominal Pain (Seen [facility] last week for possible food poisoning. Nausea, and abdominal pain. Needs refill on zofran 8 mg. Lower stomach gas pains. )  Abdominal Pain  This is a chronic problem. The problem occurs daily. The problem has been gradually worsening. The pain is located in the generalized abdominal region. The quality of the pain is cramping and colicky. Pertinent negatives include no fever or headaches. The pain is aggravated by eating. Treatments tried: zofran, bentyl. The treatment provided mild relief.    Review of Systems  Constitutional: Positive for unexpected weight change. Negative for chills, fatigue and fever.  Respiratory: Negative for chest tightness, shortness of breath and wheezing.   Cardiovascular: Negative for chest pain and palpitations.  Gastrointestinal: Positive for abdominal pain.  Neurological: Negative for dizziness and headaches.    Patient Active Problem List   Diagnosis Date Noted  . Serrated adenoma of colon 04/05/2016  . Elbow injury 09/29/2015  . Hx of adenomatous colonic polyps 11/20/2014  . Allergic rhinitis 11/19/2014  . AD (atopic dermatitis) 11/19/2014  . 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 Dyslipidemia 11/19/2014  . Acid reflux 11/19/2014  . Hemangioma of liver 11/19/2014  . Herpes 11/19/2014  . Irritable bowel syndrome with diarrhea 11/19/2014  . Klinefelter syndrome 11/19/2014  . Acquired cyst of kidney 11/19/2014  . Headache, tension-type 11/19/2014  . H/O arthrodesis 02/05/2013  . Common wart 02/05/2013    Allergies  Allergen Reactions  . Codeine Nausea And Vomiting and Nausea Only    Patient states he feels "near death" when taking  . Levofloxacin Nausea And Vomiting    Past Surgical History:  Procedure Laterality Date  . APPENDECTOMY    . CERVICAL DISC SURGERY  2007    Social History    Tobacco Use  . Smoking status: Never Smoker  . Smokeless tobacco: Never Used  Substance Use Topics  . Alcohol use: No    Alcohol/week: 0.0 standard drinks  . Drug use: No     Medication list has been reviewed and updated.  Current Meds  Medication Sig  . acyclovir cream (ZOVIRAX) 5 % Apply 1 application topically 2 (two) times daily.  Marland Kitchen aspirin EC 81 MG tablet Take 1 tablet by mouth every other day.   . Coconut Oil 1000 MG CAPS Take by mouth.  . Dextrose-Fructose-Sod Citrate (NAUZENE PO) Take by mouth.  . dicyclomine (BENTYL) 10 MG capsule Take 10 mg by mouth daily as needed for spasms.  . milk thistle 175 MG tablet Take 175 mg by mouth daily.  . montelukast (SINGULAIR) 10 MG tablet TAKE 1 TABLET BY MOUTH AT BEDTIME  . olive oil external oil Apply topically as needed.  . Omega-3 Fatty Acids (FISH OIL) 1000 MG CAPS Take 2 capsules (2,000 mg total) by mouth 2 (two) times daily.  Marland Kitchen omeprazole (PRILOSEC) 20 MG capsule Take 1 capsule (20 mg total) by mouth daily.  . ondansetron (ZOFRAN) 8 MG tablet Take by mouth every 8 (eight) hours as needed for nausea or vomiting.  . Probiotic Product (PROBIOTIC-10 PO) Take by mouth.  . testosterone (ANDROGEL) 50 MG/5GM (1%) GEL Place 10 g onto the skin daily.    PHQ 2/9 Scores 08/06/2017  PHQ - 2 Score 0  PHQ- 9 Score 0    Physical Exam  Constitutional: He is oriented to person, place, and time.  He appears well-developed. No distress.  HENT:  Head: Normocephalic and atraumatic.  Eyes: Pupils are equal, round, and reactive to light.  Cardiovascular: Normal rate, regular rhythm and normal heart sounds.  Pulmonary/Chest: Effort normal. No respiratory distress.  Abdominal: Soft. Normal appearance and bowel sounds are normal. He exhibits no distension and no ascites. There is no hepatosplenomegaly. There is no tenderness. There is no rigidity, no guarding and negative Murphy's sign.  Musculoskeletal: Normal range of motion.  Neurological: He is  alert and oriented to person, place, and time.  Skin: Skin is warm and dry. No rash noted.  Psychiatric: He has a normal mood and affect. His behavior is normal. Thought content normal.  Nursing note and vitals reviewed.   BP 132/80 (BP Location: Right Arm, Patient Position: Sitting, Cuff Size: Normal)   Pulse 74   Ht 5\' 11"  (1.803 m)   Wt 163 lb (73.9 kg)   SpO2 98%   BMI 22.73 kg/m   Assessment and Plan: 1. Irritable bowel syndrome with diarrhea Begin Bentyl tid See GI - dicyclomine (BENTYL) 10 MG capsule; Take 1 capsule (10 mg total) by mouth daily as needed for spasms.  Dispense: 90 capsule; Refill: 2 - ondansetron (ZOFRAN-ODT) 8 MG disintegrating tablet; Take 1 tablet (8 mg total) by mouth every 8 (eight) hours as needed for nausea or vomiting.  Dispense: 30 tablet; Refill: 2 - Ambulatory referral to Gastroenterology  2. Serrated adenoma of colon Due for follow up colonoscopy - Ambulatory referral to Gastroenterology   Partially dictated using [place]. Any errors are unintentional.  Halina Maidens, MD [facility]  02/27/2018

## 2018-02-28 ENCOUNTER — Other Ambulatory Visit: Payer: Self-pay

## 2018-02-28 MED ORDER — ONDANSETRON HCL 8 MG PO TABS: 8.0000 mg | ORAL_TABLET | Freq: Three times a day (TID) | ORAL | 2 refills | Status: DC | PRN

## 2018-03-05 ENCOUNTER — Encounter: Payer: Self-pay | Admitting: Internal Medicine

## 2018-05-01 ENCOUNTER — Other Ambulatory Visit: Payer: Self-pay | Admitting: Internal Medicine

## 2018-05-01 DIAGNOSIS — J3089 Other allergic rhinitis: Secondary | ICD-10-CM

## 2018-08-05 ENCOUNTER — Other Ambulatory Visit: Payer: Self-pay | Admitting: Internal Medicine

## 2018-09-09 ENCOUNTER — Other Ambulatory Visit: Payer: Self-pay | Admitting: Internal Medicine

## 2018-09-09 DIAGNOSIS — J3089 Other allergic rhinitis: Secondary | ICD-10-CM

## 2018-10-30 ENCOUNTER — Other Ambulatory Visit: Payer: Self-pay | Admitting: Internal Medicine

## 2018-10-30 DIAGNOSIS — K580 Irritable bowel syndrome with diarrhea: Secondary | ICD-10-CM

## 2018-10-31 NOTE — Telephone Encounter (Signed)
LM to with male to have him call us back to make appointment.

## 2018-12-09 ENCOUNTER — Other Ambulatory Visit: Payer: Self-pay | Admitting: Internal Medicine

## 2018-12-10 ENCOUNTER — Encounter: Payer: Self-pay | Admitting: Internal Medicine

## 2018-12-10 ENCOUNTER — Other Ambulatory Visit: Payer: Self-pay | Admitting: Internal Medicine

## 2018-12-12 ENCOUNTER — Telehealth: Payer: Self-pay

## 2018-12-12 NOTE — Telephone Encounter (Signed)
Tried calling patient HOME phone twice. The phone hung up on me both times. Left Vm on Cell phone to inform patient we received a request for androgel. We have to see the patient every 6 months to continue to fill this medication for him because it is controlled.  Told him to call and schedule a follow up with Army Melia.  Awaiting call back to discuss.

## 2019-03-03 ENCOUNTER — Other Ambulatory Visit: Payer: Self-pay | Admitting: Internal Medicine

## 2019-03-03 DIAGNOSIS — Q984 Klinefelter syndrome, unspecified: Secondary | ICD-10-CM

## 2019-03-03 MED ORDER — TESTOSTERONE 50 MG/5GM (1%) TD GEL: 10.0000 g | Freq: Every day | TRANSDERMAL | 1 refills | Status: DC

## 2019-03-26 ENCOUNTER — Other Ambulatory Visit: Payer: Self-pay | Admitting: Internal Medicine

## 2019-03-26 DIAGNOSIS — J3089 Other allergic rhinitis: Secondary | ICD-10-CM

## 2019-03-27 NOTE — Telephone Encounter (Signed)
Could not LM to call back

## 2019-04-25 ENCOUNTER — Other Ambulatory Visit: Payer: Self-pay | Admitting: Internal Medicine

## 2019-04-28 NOTE — Telephone Encounter (Signed)
Could not LM

## 2019-05-21 NOTE — Telephone Encounter (Signed)
LM to call back.

## 2019-06-03 ENCOUNTER — Other Ambulatory Visit: Payer: Self-pay | Admitting: Internal Medicine

## 2019-06-17 ENCOUNTER — Telehealth: Payer: Self-pay

## 2019-06-17 NOTE — Telephone Encounter (Signed)
Please try reaching out to patient again and documenting that you called. I have tried several time. He can be a VV for a general follow up.

## 2019-06-17 NOTE — Telephone Encounter (Signed)
Yes, a virtual follow up would be fine.  He needs to have his vital signs, weight, et ready.

## 2019-06-17 NOTE — Telephone Encounter (Signed)
Patient called saying he did get my message from months ago about scheduling an appt with Dr. Army Melia to follow up on medications. He would like to do this over the phone because he is afraid to leave his home. He has not left out of his home nor his wife for at least 8 months besides walking down the street.  Please advise?

## 2019-06-20 ENCOUNTER — Other Ambulatory Visit: Payer: Self-pay | Admitting: Internal Medicine

## 2019-07-23 ENCOUNTER — Ambulatory Visit (INDEPENDENT_AMBULATORY_CARE_PROVIDER_SITE_OTHER): Payer: PRIVATE HEALTH INSURANCE | Admitting: Internal Medicine

## 2019-07-23 ENCOUNTER — Other Ambulatory Visit: Payer: Self-pay

## 2019-07-23 ENCOUNTER — Encounter: Payer: Self-pay | Admitting: Internal Medicine

## 2019-07-23 VITALS — BP 130/78 | HR 100 | Ht 71.0 in | Wt 167.0 lb

## 2019-07-23 DIAGNOSIS — K219 Gastro-esophageal reflux disease without esophagitis: Secondary | ICD-10-CM | POA: Diagnosis not present

## 2019-07-23 DIAGNOSIS — Z125 Encounter for screening for malignant neoplasm of prostate: Secondary | ICD-10-CM

## 2019-07-23 DIAGNOSIS — R350 Frequency of micturition: Secondary | ICD-10-CM

## 2019-07-23 DIAGNOSIS — Q984 Klinefelter syndrome, unspecified: Secondary | ICD-10-CM | POA: Diagnosis not present

## 2019-07-23 DIAGNOSIS — N341 Nonspecific urethritis: Secondary | ICD-10-CM | POA: Diagnosis not present

## 2019-07-23 LAB — POCT URINALYSIS DIPSTICK
Bilirubin, UA: NEGATIVE
Blood, UA: NEGATIVE
Glucose, UA: NEGATIVE
Ketones, UA: NEGATIVE
Leukocytes, UA: NEGATIVE
Nitrite, UA: NEGATIVE
Protein, UA: NEGATIVE
Spec Grav, UA: <=1.005 — AB (ref 1.010–1.025)
Urobilinogen, UA: 0.2 E.U./dL
pH, UA: 6 (ref 5.0–8.0)

## 2019-07-23 MED ORDER — AZITHROMYCIN 200 MG/5ML PO SUSR: 1000.0000 mg | Freq: Every day | ORAL | 0 refills | Status: DC

## 2019-07-23 MED ORDER — ONDANSETRON 8 MG PO TBDP: 8.0000 mg | ORAL_TABLET | Freq: Three times a day (TID) | ORAL | 2 refills | Status: AC | PRN

## 2019-07-23 NOTE — Progress Notes (Signed)
Date:  07/23/2019   Name:  Joseph Chung   DOB:  1961/04/16   MRN:  [id number]   Chief Complaint: Penis Pain (3 days ago- when urinating he had extreme pain when urinating. noticed two white specs coming out of his penis. 6-8 specs. Threw the specs away. Only slight pain as of today. Urgency to go to the bathroom often. )  Urinary Frequency  This is a new problem. The current episode started in the past 7 days. The problem occurs every urination. The problem has been unchanged. The quality of the pain is described as burning. The pain is mild. There has been no fever. He is not sexually active. There is no history of pyelonephritis. Associated symptoms include frequency. Pertinent negatives include no chills, nausea or vomiting. Associated symptoms comments: And some mucus clumps at the end of urination. Treatments tried: amoxicillin. The treatment provided no relief.  Gastroesophageal Reflux He complains of heartburn. He reports no abdominal pain, no chest pain, no nausea or no sore throat. This is a recurrent problem. The problem occurs occasionally. The problem has been unchanged. Pertinent negatives include no fatigue. He has tried a PPI for the symptoms. The treatment provided significant relief.  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  No side effects to medication noted. No recent labs.  Lab Results  Component Value Date   CREATININE 0.94 12/15/2016   BUN 10 12/15/2016   NA 145 (H) 12/15/2016   K 4.1 12/15/2016   CL 101 12/15/2016   CO2 28 12/15/2016   No results found for: CHOL, HDL, LDLCALC, LDLDIRECT, TRIG, CHOLHDL Lab Results  Component Value Date   TSH 2.250 12/15/2016   No results found for: HGBA1C   Review of Systems  Constitutional: Negative for chills, fatigue and fever.  HENT: Negative for sore throat and trouble swallowing.   Respiratory: Negative for chest tightness.   Cardiovascular: Negative for chest pain.  Gastrointestinal: Positive  for heartburn. Negative for abdominal pain, constipation, diarrhea, nausea and vomiting.       Some gerd  Genitourinary: Positive for frequency.  Neurological: Negative for dizziness, light-headedness and headaches.    Patient Active Problem List   Diagnosis Date Noted  . Serrated adenoma of colon 04/05/2016  . Hx of adenomatous colonic polyps 11/20/2014  . Allergic rhinitis 11/19/2014  . AD (atopic dermatitis) 11/19/2014  . 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 Dyslipidemia 11/19/2014  . Acid reflux 11/19/2014  . Hemangioma of liver 11/19/2014  . Herpes 11/19/2014  . Irritable bowel syndrome with diarrhea 11/19/2014  . Klinefelter syndrome 11/19/2014  . Acquired cyst of kidney 11/19/2014  . Headache, tension-type 11/19/2014  . H/O arthrodesis 02/05/2013  . Common wart 02/05/2013    Allergies  Allergen Reactions  . Codeine Nausea And Vomiting and Nausea Only    Patient states he feels "near death" when taking  . Levofloxacin Nausea And Vomiting    Past Surgical History:  Procedure Laterality Date  . APPENDECTOMY    . [place] SURGERY  2007  . COLONOSCOPY  04/08/2013   sessile serrated polyps    Social History   Tobacco Use  . Smoking status: Never Smoker  . Smokeless tobacco: Never Used  Substance Use Topics  . Alcohol use: No    Alcohol/week: 0.0 standard drinks  . Drug use: No     Medication list has been reviewed and updated.  Current Meds  Medication Sig  . aspirin EC 81 MG tablet  Take 1 tablet by mouth every other day.   . Coconut Oil 1000 MG CAPS Take by mouth.  . Dextrose-Fructose-Sod Citrate (NAUZENE PO) Take by mouth.  . dicyclomine (BENTYL) 10 MG capsule TAKE 1 CAPSULE (10 MG TOTAL) BY MOUTH DAILY AS NEEDED FOR SPASMS.  . milk thistle 175 MG tablet Take 175 mg by mouth daily.  . montelukast (SINGULAIR) 10 MG tablet TAKE 1 TABLET BY MOUTH AT BEDTIME  . olive oil external oil Apply topically as needed.  Marland Kitchen  omeprazole (PRILOSEC) 20 MG capsule TAKE 1 CAPSULE BY MOUTH EVERY DAY  . ondansetron (ZOFRAN-ODT) 8 MG disintegrating tablet Take 1 tablet (8 mg total) by mouth every 8 (eight) hours as needed for nausea or vomiting.  . Probiotic Product (PROBIOTIC-10 PO) Take by mouth.  . testosterone (ANDROGEL) 50 MG/5GM (1%) GEL Place 10 g onto the skin daily.  . [DISCONTINUED] ondansetron (ZOFRAN-ODT) 8 MG disintegrating tablet TAKE 1 TABLET (8 MG TOTAL) BY MOUTH EVERY 8 (EIGHT) HOURS AS NEEDED FOR NAUSEA OR VOMITING.    PHQ 2/9 Scores 07/23/2019 08/06/2017  PHQ - 2 Score 0 0  PHQ- 9 Score 0 0    BP Readings from Last 3 Encounters:  07/23/19 130/78  02/27/18 132/80  08/06/17 (!) 148/104    Physical Exam Vitals and nursing note reviewed.  Constitutional:      General: He is not in acute distress.    Appearance: Normal appearance. He is well-developed.  HENT:     Head: Normocephalic and atraumatic.  Cardiovascular:     Rate and Rhythm: Normal rate and regular rhythm.     Pulses: Normal pulses.     Heart sounds: No murmur.  Pulmonary:     Effort: Pulmonary effort is normal. No respiratory distress.     Breath sounds: No wheezing or rhonchi.  Abdominal:     General: Abdomen is flat.     Palpations: Abdomen is soft.     Tenderness: There is no abdominal tenderness. There is no right CVA tenderness or left CVA tenderness.  Musculoskeletal:     Cervical back: Normal range of motion.     Right lower leg: No edema.     Left lower leg: No edema.  Lymphadenopathy:     Cervical: No cervical adenopathy.  Skin:    General: Skin is warm and dry.     Capillary Refill: Capillary refill takes less than 2 seconds.     Findings: No rash.  Neurological:     General: No focal deficit present.     Mental Status: He is alert and oriented to person, place, and time.  Psychiatric:        Behavior: Behavior normal.        Thought Content: Thought content normal.     Wt Readings from Last 3 Encounters:    07/23/19 167 lb (75.8 kg)  02/27/18 163 lb (73.9 kg)  08/06/17 179 lb (81.2 kg)    BP 130/78   Pulse 100   Ht 5\' 11"  (1.803 m)   Wt 167 lb (75.8 kg)   SpO2 96%   BMI 23.29 kg/m   Assessment and Plan: 1. Urethritis, nongonococcal Will treat with single dose of azithromycin 1000 mg - CBC with Differential/Platelet - azithromycin (ZITHROMAX) 200 MG/5ML suspension; Take 25 mLs (1,000 mg total) by mouth daily.  Dispense: 30 mL; Refill: 0  2. Urinary frequency UA negative - POCT urinalysis dipstick  3. Klinefelter syndrome Continue topical testosterone - Comprehensive metabolic panel  4.  Gastroesophageal reflux disease without esophagitis Symptoms well controlled on daily PPI No red flag signs such as weight loss, n/v, melena.  He continues to have intermittent nausea for which he takes zofran Will continue omeprazole. - ondansetron (ZOFRAN-ODT) 8 MG disintegrating tablet; Take 1 tablet (8 mg total) by mouth every 8 (eight) hours as needed for nausea or vomiting.  Dispense: 30 tablet; Refill: 2 - CBC with Differential/Platelet  5. Prostate cancer screening DRE deferred - PSA   Partially dictated using Dragon software. Any errors are unintentional.  Halina Maidens, MD [facility]  07/23/2019

## 2019-07-24 ENCOUNTER — Encounter: Payer: Self-pay | Admitting: Internal Medicine

## 2019-07-24 LAB — COMPREHENSIVE METABOLIC PANEL
ALT: 30 IU/L (ref 0–44)
AST: 20 IU/L (ref 0–40)
Albumin/Globulin Ratio: 1.9 (ref 1.2–2.2)
Albumin: 4.7 g/dL (ref 3.8–4.9)
Alkaline Phosphatase: 92 IU/L (ref 39–117)
BUN/Creatinine Ratio: 15 (ref 9–20)
BUN: 13 mg/dL (ref 6–24)
Bilirubin Total: 0.4 mg/dL (ref 0.0–1.2)
CO2: 29 mmol/L (ref 20–29)
Calcium: 11.1 mg/dL — ABNORMAL HIGH (ref 8.7–10.2)
Chloride: 102 mmol/L (ref 96–106)
Creatinine, Ser: 0.89 mg/dL (ref 0.76–1.27)
GFR calc Af Amer: 109 mL/min/{1.73_m2} (ref >59)
GFR calc non Af Amer: 94 mL/min/{1.73_m2} (ref >59)
Globulin, Total: 2.5 g/dL (ref 1.5–4.5)
Glucose: 86 mg/dL (ref 65–99)
Potassium: 4.8 mmol/L (ref 3.5–5.2)
Sodium: 144 mmol/L (ref 134–144)
Total Protein: 7.2 g/dL (ref 6.0–8.5)

## 2019-07-24 LAB — CBC WITH DIFFERENTIAL/PLATELET
Basophils Absolute: 0.1 10*3/uL (ref 0.0–0.2)
Basos: 1 %
EOS (ABSOLUTE): 0.1 10*3/uL (ref 0.0–0.4)
Eos: 1 %
Hematocrit: 45.8 % (ref 37.5–51.0)
Hemoglobin: 15.9 g/dL (ref 13.0–17.7)
Immature Grans (Abs): 0 10*3/uL (ref 0.0–0.1)
Immature Granulocytes: 0 %
Lymphocytes Absolute: 1.6 10*3/uL (ref 0.7–3.1)
Lymphs: 27 %
MCH: 29.1 pg (ref 26.6–33.0)
MCHC: 34.7 g/dL (ref 31.5–35.7)
MCV: 84 fL (ref 79–97)
Monocytes Absolute: 0.7 10*3/uL (ref 0.1–0.9)
Monocytes: 11 %
Neutrophils Absolute: 3.7 10*3/uL (ref 1.4–7.0)
Neutrophils: 60 %
Platelets: 217 10*3/uL (ref 150–450)
RBC: 5.46 x10E6/uL (ref 4.14–5.80)
RDW: 13.4 % (ref 11.6–15.4)
WBC: 6.1 10*3/uL (ref 3.4–10.8)

## 2019-07-24 LAB — PSA: Prostate Specific Ag, Serum: 0.6 ng/mL (ref 0.0–4.0)

## 2019-08-04 ENCOUNTER — Other Ambulatory Visit: Payer: Self-pay | Admitting: Internal Medicine

## 2019-08-04 ENCOUNTER — Encounter: Payer: Self-pay | Admitting: Internal Medicine

## 2019-08-04 DIAGNOSIS — Q984 Klinefelter syndrome, unspecified: Secondary | ICD-10-CM

## 2019-08-05 ENCOUNTER — Other Ambulatory Visit: Payer: Self-pay | Admitting: Internal Medicine

## 2019-08-05 DIAGNOSIS — N341 Nonspecific urethritis: Secondary | ICD-10-CM

## 2019-08-05 MED ORDER — AZITHROMYCIN 500 MG PO TABS: 1000.0000 mg | ORAL_TABLET | Freq: Every day | ORAL | 0 refills | Status: AC

## 2019-08-11 ENCOUNTER — Encounter: Payer: Self-pay | Admitting: Internal Medicine

## 2019-08-29 ENCOUNTER — Encounter: Payer: Self-pay | Admitting: Internal Medicine

## 2019-10-01 ENCOUNTER — Encounter: Payer: Self-pay | Admitting: Internal Medicine

## 2019-10-17 ENCOUNTER — Other Ambulatory Visit: Payer: Self-pay | Admitting: Internal Medicine

## 2019-10-17 DIAGNOSIS — J3089 Other allergic rhinitis: Secondary | ICD-10-CM

## 2020-04-05 ENCOUNTER — Encounter: Payer: Self-pay | Admitting: Internal Medicine

## 2020-08-22 ENCOUNTER — Other Ambulatory Visit: Payer: Self-pay | Admitting: Internal Medicine
